# Patient Record
Sex: Male | Born: 1965 | Race: Black or African American | Hispanic: No | Marital: Married | Smoking: Never smoker
Health system: Southern US, Community
[De-identification: ages and names within clinical notes are randomized; demographics above are authoritative.]

## PROBLEM LIST (undated history)

## (undated) DIAGNOSIS — I10 Essential (primary) hypertension: Secondary | ICD-10-CM

## (undated) DIAGNOSIS — I4729 Other ventricular tachycardia: Secondary | ICD-10-CM

## (undated) DIAGNOSIS — E7800 Pure hypercholesterolemia, unspecified: Secondary | ICD-10-CM

## (undated) DIAGNOSIS — I472 Ventricular tachycardia: Secondary | ICD-10-CM

## (undated) DIAGNOSIS — I428 Other cardiomyopathies: Secondary | ICD-10-CM

## (undated) HISTORY — DX: Other cardiomyopathies: I42.8

## (undated) HISTORY — DX: Other ventricular tachycardia: I47.29

## (undated) HISTORY — DX: Ventricular tachycardia: I47.2

---

## 2015-09-06 ENCOUNTER — Ambulatory Visit: Payer: Self-pay

## 2015-11-15 ENCOUNTER — Ambulatory Visit (INDEPENDENT_AMBULATORY_CARE_PROVIDER_SITE_OTHER): Payer: Medicaid Other | Admitting: Family Medicine

## 2015-11-15 VITALS — BP 149/98 | HR 81 | Temp 98.4°F | Ht 68.25 in | Wt 203.4 lb

## 2015-11-15 DIAGNOSIS — E871 Hypo-osmolality and hyponatremia: Secondary | ICD-10-CM | POA: Insufficient documentation

## 2015-11-15 DIAGNOSIS — IMO0001 Reserved for inherently not codable concepts without codable children: Secondary | ICD-10-CM | POA: Insufficient documentation

## 2015-11-15 DIAGNOSIS — Z0289 Encounter for other administrative examinations: Secondary | ICD-10-CM | POA: Insufficient documentation

## 2015-11-15 DIAGNOSIS — M545 Low back pain: Secondary | ICD-10-CM

## 2015-11-15 DIAGNOSIS — R030 Elevated blood-pressure reading, without diagnosis of hypertension: Secondary | ICD-10-CM | POA: Diagnosis not present

## 2015-11-15 DIAGNOSIS — K219 Gastro-esophageal reflux disease without esophagitis: Secondary | ICD-10-CM

## 2015-11-15 DIAGNOSIS — M549 Dorsalgia, unspecified: Secondary | ICD-10-CM | POA: Insufficient documentation

## 2015-11-15 DIAGNOSIS — Z008 Encounter for other general examination: Secondary | ICD-10-CM

## 2015-11-15 DIAGNOSIS — I517 Cardiomegaly: Secondary | ICD-10-CM | POA: Insufficient documentation

## 2015-11-15 LAB — POCT H PYLORI SCREEN: H Pylori Screen, POC: POSITIVE

## 2015-11-15 MED ORDER — ACETAMINOPHEN 500 MG PO TABS: 500.0000 mg | ORAL_TABLET | Freq: Four times a day (QID) | ORAL | Status: DC | PRN

## 2015-11-15 MED ORDER — OMEPRAZOLE 40 MG PO CPDR: 40.0000 mg | DELAYED_RELEASE_CAPSULE | Freq: Every day | ORAL | Status: DC

## 2015-11-15 MED ORDER — OMEPRAZOLE 40 MG PO CPDR
40.0000 mg | DELAYED_RELEASE_CAPSULE | Freq: Every day | ORAL | Status: DC
Start: 2015-11-15 — End: 2017-03-31

## 2015-11-15 NOTE — Patient Instructions (Signed)
It was good to see you today!  Take the purple pill once a day to help with your stomach.  Take the Tylenol for back pain.  Come back in 4 - 6 weeks to see me.
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
## 2015-11-15 NOTE — Assessment & Plan Note (Signed)
Not present at [facility] Department.   Recheck next visit in 4 - 6 weeks.

## 2015-11-15 NOTE — Assessment & Plan Note (Signed)
Presents with some paperwork from the health dept.  Mentions need for stool studies, cardiac enlargement, aortal dilation, and hyponatremia.  Will check BMET today plus stool studies.  Will discuss Echo at next visit.

## 2015-11-16 LAB — COMPREHENSIVE METABOLIC PANEL
ALK PHOS: 89 U/L (ref 40–115)
ALT: 27 U/L (ref 9–46)
AST: 25 U/L (ref 10–35)
Albumin: 4.2 g/dL (ref 3.6–5.1)
BILIRUBIN TOTAL: 0.5 mg/dL (ref 0.2–1.2)
BUN: 9 mg/dL (ref 7–25)
CO2: 28 mmol/L (ref 20–31)
CREATININE: 0.81 mg/dL (ref 0.70–1.33)
Calcium: 9.3 mg/dL (ref 8.6–10.3)
Chloride: 100 mmol/L (ref 98–110)
GLUCOSE: 109 mg/dL — AB (ref 65–99)
Potassium: 4.1 mmol/L (ref 3.5–5.3)
SODIUM: 137 mmol/L (ref 135–146)
Total Protein: 7.4 g/dL (ref 6.1–8.1)

## 2015-11-16 LAB — CBC WITH DIFFERENTIAL/PLATELET
BASOS ABS: 0 10*3/uL (ref 0.0–0.1)
BASOS PCT: 0 % (ref 0–1)
EOS PCT: 7 % — AB (ref 0–5)
Eosinophils Absolute: 0.4 10*3/uL (ref 0.0–0.7)
HEMATOCRIT: 42.6 % (ref 39.0–52.0)
HEMOGLOBIN: 14.4 g/dL (ref 13.0–17.0)
LYMPHS PCT: 52 % — AB (ref 12–46)
Lymphs Abs: 3.3 10*3/uL (ref 0.7–4.0)
MCH: 27.8 pg (ref 26.0–34.0)
MCHC: 33.8 g/dL (ref 30.0–36.0)
MCV: 82.2 fL (ref 78.0–100.0)
MPV: 9.7 fL (ref 8.6–12.4)
Monocytes Absolute: 0.8 10*3/uL (ref 0.1–1.0)
Monocytes Relative: 12 % (ref 3–12)
NEUTROS ABS: 1.8 10*3/uL (ref 1.7–7.7)
Neutrophils Relative %: 29 % — ABNORMAL LOW (ref 43–77)
Platelets: 235 10*3/uL (ref 150–400)
RBC: 5.18 MIL/uL (ref 4.22–5.81)
RDW: 14.3 % (ref 11.5–15.5)
WBC: 6.3 10*3/uL (ref 4.0–10.5)

## 2015-11-16 LAB — LIPID PANEL
Cholesterol: 225 mg/dL — ABNORMAL HIGH (ref 125–200)
HDL: 28 mg/dL — ABNORMAL LOW (ref >=40)
LDL CALC: 140 mg/dL — AB (ref <130)
Total CHOL/HDL Ratio: 8 Ratio — ABNORMAL HIGH (ref <=5.0)
Triglycerides: 284 mg/dL — ABNORMAL HIGH (ref <150)
VLDL: 57 mg/dL — ABNORMAL HIGH (ref <30)

## 2015-11-16 LAB — TSH: TSH: 1.988 u[IU]/mL (ref 0.350–4.500)

## 2015-11-20 ENCOUNTER — Encounter: Payer: Self-pay | Admitting: Family Medicine

## 2015-11-20 LAB — STOOL CULTURE

## 2015-12-15 ENCOUNTER — Encounter: Payer: Self-pay | Admitting: Family Medicine

## 2015-12-15 ENCOUNTER — Ambulatory Visit (INDEPENDENT_AMBULATORY_CARE_PROVIDER_SITE_OTHER): Payer: Medicaid Other | Admitting: Family Medicine

## 2015-12-15 VITALS — BP 148/87 | HR 83 | Temp 98.1°F | Ht 68.25 in | Wt 197.2 lb

## 2015-12-15 DIAGNOSIS — R1013 Epigastric pain: Secondary | ICD-10-CM | POA: Diagnosis not present

## 2015-12-15 DIAGNOSIS — H1013 Acute atopic conjunctivitis, bilateral: Secondary | ICD-10-CM | POA: Diagnosis not present

## 2015-12-15 DIAGNOSIS — E871 Hypo-osmolality and hyponatremia: Secondary | ICD-10-CM

## 2015-12-15 DIAGNOSIS — M545 Low back pain: Secondary | ICD-10-CM | POA: Diagnosis not present

## 2015-12-15 LAB — POCT UA - MICROSCOPIC ONLY

## 2015-12-15 LAB — POCT URINALYSIS DIPSTICK
Bilirubin, UA: NEGATIVE
GLUCOSE UA: NEGATIVE
Ketones, UA: NEGATIVE
LEUKOCYTES UA: NEGATIVE
NITRITE UA: NEGATIVE
PROTEIN UA: NEGATIVE
UROBILINOGEN UA: 1
pH, UA: 6.5

## 2015-12-15 MED ORDER — OLOPATADINE HCL 0.2 % OP SOLN
OPHTHALMIC | Status: DC
Start: 2015-12-15 — End: 2016-08-23

## 2015-12-15 MED ORDER — AMOXICILL-CLARITHRO-LANSOPRAZ PO MISC
Freq: Two times a day (BID) | ORAL | Status: DC
Start: 2015-12-15 — End: 2017-03-31
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
## 2015-12-15 NOTE — Patient Instructions (Signed)
Take the medicine twice daily (it will be 3 pills).  Come back to see me in about 1 month to see how you're doing.    It was good to see you today.

## 2015-12-16 DIAGNOSIS — H1010 Acute atopic conjunctivitis, unspecified eye: Secondary | ICD-10-CM | POA: Insufficient documentation

## 2015-12-16 DIAGNOSIS — R1013 Epigastric pain: Secondary | ICD-10-CM | POA: Insufficient documentation

## 2015-12-16 NOTE — Assessment & Plan Note (Signed)
Improved when checked last visit.

## 2015-12-16 NOTE — Assessment & Plan Note (Addendum)
Positive H pylori screen from last visit.  Treat with triple therapy today.  FU for improvement in 1 month.  Of note, UA was obtained.  Trace RBC, will repeat next visit.

## 2015-12-16 NOTE — Assessment & Plan Note (Signed)
Worsened after arrival in [place]. Treat with [place].

## 2016-08-23 ENCOUNTER — Other Ambulatory Visit: Payer: Self-pay | Admitting: Family Medicine

## 2017-03-31 ENCOUNTER — Ambulatory Visit (INDEPENDENT_AMBULATORY_CARE_PROVIDER_SITE_OTHER): Payer: BLUE CROSS/BLUE SHIELD | Admitting: Family Medicine

## 2017-03-31 DIAGNOSIS — K219 Gastro-esophageal reflux disease without esophagitis: Secondary | ICD-10-CM

## 2017-03-31 MED ORDER — OMEPRAZOLE 40 MG PO CPDR: 40.0000 mg | DELAYED_RELEASE_CAPSULE | Freq: Every day | ORAL | 5 refills | Status: DC

## 2017-03-31 MED ORDER — ACETAMINOPHEN 500 MG PO TABS: 500.0000 mg | ORAL_TABLET | Freq: Four times a day (QID) | ORAL | 11 refills | Status: DC | PRN

## 2017-03-31 MED ORDER — MULTIVITAMIN ADULT PO CHEW: 1.0000 | CHEWABLE_TABLET | Freq: Every day | ORAL | 11 refills | Status: DC

## 2017-03-31 NOTE — Assessment & Plan Note (Signed)
Patient is here complaining of a cough. Signs and symptoms consistent with gastric reflux. Patient has a history of gastric ulcer. - PPI restarted today. - F/u PRN

## 2017-03-31 NOTE — Progress Notes (Signed)
   HPI  CC: COUGH Cough and sputum for about a week. Seems to be worse at work, and at night. But better in at home. Works at [employer].   Has been coughing for ~7 days. Cough is: productive Sputum production: yes, yellow. Medications tried: none Taking blood pressure medications: no  Symptoms Runny nose: no Mucous in back of throat: no Throat burning or reflux: no Wheezing or asthma: no  Fever: no Chest Pain: no Shortness of breath: only when coughing. Leg swelling: no Hemoptysis: no Weight loss: no  ROS see HPI Smoking Status noted  CC, SH/smoking status, and VS noted  Objective: BP (!) 144/90   Pulse 77   Temp 98.5 F (36.9 C) (Oral)   Wt 191 lb (86.6 kg)   BMI 28.83 kg/m  Gen: NAD, alert, cooperative, and pleasant. HEENT: MMM, EOMI, PERRLA, OP minimally erythematous without evidence of exudates, TMs clear bilaterally, no LAD, neck full ROM. CV: RRR, no murmur Resp: CTAB, no wheezes, non-labored Abd: SNTND, BS present, no guarding or organomegaly Ext: No edema, warm Neuro: Alert and oriented, Speech clear, No gross deficits   Assessment and plan:  GERD (gastroesophageal reflux disease) Patient is here complaining of a cough. Signs and symptoms consistent with gastric reflux. Patient has a history of gastric ulcer. - PPI restarted today. - F/u PRN   Meds ordered this encounter  Medications  . acetaminophen (TYLENOL) 500 MG tablet    Sig: Take 1 tablet (500 mg total) by mouth every 6 (six) hours as needed.    Dispense:  30 tablet    Refill:  11  . omeprazole (PRILOSEC) 40 MG capsule    Sig: Take 1 capsule (40 mg total) by mouth daily.    Dispense:  30 capsule    Refill:  5  . Multiple Vitamins-Minerals (MULTIVITAMIN ADULT) CHEW    Sig: Chew 1 tablet by mouth daily.    Dispense:  30 tablet    Refill:  11     Kathee Delton, MD,MS,  PGY3 03/31/2017 6:26 PM

## 2017-03-31 NOTE — Patient Instructions (Signed)
It was a pleasure seeing you today in our clinic. Today we discussed your cough. Here is the treatment plan we have discussed and agreed upon together:   - I believe your cough is likely related to some gastric reflux. Because of this I have prescribed you omeprazole. You have taken this in the past. Take 1 tablet every day for the next 6 months. If your symptoms recur after 6 months come back and see me for a refill.

## 2017-05-28 ENCOUNTER — Encounter: Payer: Self-pay | Admitting: Family Medicine

## 2017-05-28 ENCOUNTER — Ambulatory Visit (INDEPENDENT_AMBULATORY_CARE_PROVIDER_SITE_OTHER): Payer: BLUE CROSS/BLUE SHIELD | Admitting: Family Medicine

## 2017-05-28 VITALS — BP 130/88 | HR 78 | Temp 98.2°F | Ht 68.25 in | Wt 192.0 lb

## 2017-05-28 DIAGNOSIS — R05 Cough: Secondary | ICD-10-CM

## 2017-05-28 DIAGNOSIS — R059 Cough, unspecified: Secondary | ICD-10-CM

## 2017-05-28 MED ORDER — BENZONATATE 100 MG PO CAPS: 200.0000 mg | ORAL_CAPSULE | Freq: Three times a day (TID) | ORAL | 0 refills | Status: DC | PRN

## 2017-05-28 MED ORDER — AZITHROMYCIN 250 MG PO TABS: ORAL_TABLET | ORAL | 0 refills | Status: DC

## 2017-05-28 NOTE — Patient Instructions (Signed)
Start the zpack and tesselon.  Come back if not improving.  Take care,  Dr Jimmey Ralph

## 2017-05-28 NOTE — Progress Notes (Signed)
    Subjective:  Adam Hebert is a 51 y.o. male who presents to the [facility] today with a chief complaint of cough. History is provided by the patient via video Arabic interpretor.   HPI:  Cough Symptoms started about 2 weeks ago. Cough is productive of phlegm. Associated with subjective fever and malaise. He has some shortness of breath and stabbing sensation in his chest when coughing. Has had some sick contacts. Occasional sore throat. No rhinorrhea or chest pain  ROS: Per HPI  PMH: Smoking history reviewed.   Objective:  Physical Exam: BP 130/88   Pulse 78   Temp 98.2 F (36.8 C) (Oral)   Ht 5' 8.25" (1.734 m)   Wt 192 lb (87.1 kg)   SpO2 98%   BMI 28.98 kg/m   Gen: NAD, resting comfortably HEENT: TMs clear. OP clear. No LAD CV: RRR with no murmurs appreciated Pulm: NWOB, Faint bibasilar crackles, otherwise clear.  Skin: warm, dry Neuro: grossly normal, moves all extremities Psych: Normal affect and thought content  Assessment/Plan:  Cough Given two week duration, will treat with z-pack to cover for any atypical pathogens. No fever here, though does have faint crackles on his lung exam. HE does have a history of GERD which could be contributing to chronic cough, however he is currently on omeprazole for this. Will give Rx for tessalon. Work excuse given for the next 3 days. Return precautions reviewed. Follow up as needed.   Katina Degree. Jimmey Ralph, MD [facility] Resident PGY-3 05/28/2017 9:27 AM

## 2017-10-29 NOTE — Progress Notes (Signed)
Subjective:    Patient ID: Adam Hebert, male    DOB: May 28, 1966, 51 y.o.   MRN: [id number]   CC: pain in hand & elevated blood pressure   HPI: Patient accompanied by wife. 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   Pain in hand Patient reports bilateral hand pain which he believes to be 2/2 his new job. Patient began his new job on September 20th, 2018 and since then pain in his hand has progressively gotten worse. Patient is able to grip object and have full ROM but states there is associated pain. Patient states pain is intermittent, more prominent after long use or before bed. Patient states that pain keeps him up at night and he has noticed decreased sleep because of it. Patient states pain radiates to his arms. Patient denies trauma to area, but states that wearing tight gloves at work may cause some hand swelling. Patient states one fingernail was broken at work as well. 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   Patient states that many of his co-workers have gotten notes from their physicians stating they must switch jobs and would like a similar note.   Hypertension: Patient has history of elevated blood pressure readings both in clinic and at home. States that he takes his blood pressure occasionally at [facility] and notices readings with systolics ranging from 295J-884Z and diastolics 66A-63K. Patient has never taken any medications for blood pressure. Patient states that he has a diet of rice, cooked veggies, fish and chicken and has very little salt intake. Patient denies exercise. Patient denies symptoms of SOB, CP, vision changes, LE edema, and only has headaches with associated fever or when over worked. Patient does not smoke or drink alcohol.   Objective:  BP (!) 158/98   Pulse 68   Temp 98 F (36.7 C) (Oral)    Ht '5\' 8"'  (1.727 m)   Wt 185 lb 9.6 oz (84.2 kg)   SpO2 99%   BMI 28.22 kg/m  Vitals and nursing note reviewed  General: well nourished, in no acute distress HEENT: normocephalic, PERRL, no scleral icterus or conjunctival pallor, no nasal discharge, moist mucous membranes,  Cardiac: RRR, clear S1 and S2, no murmurs, rubs, or gallops Respiratory: clear to auscultation bilaterally, no increased work of breathing Abdomen: soft, nontender, nondistended, no masses or organomegaly. Bowel sounds present Extremities: no edema or cyanosis. Warm, well perfused. Normal grip strength (but patient reports pain with grip), normal range of motion of hands. Joint swelling of MCP joints of hands bilaterally.  Skin: warm and dry, no rashes noted Neuro: alert and oriented, no focal deficits  Assessment & Plan:    Bilateral hand pain Likely 2/2 osteoarthritis given that pain is increased with overuse. Possible rheumatoid arthritis given long duration of pain. Possible gout.  -will obtain rheumatoid factor, ESR, and uric acid to rule out RA or gout as causes -will not write note dismissing employee from work at this time as this is his first encounter for pain and no further workup done -follow up if symptoms persist or worsen  -conservative measures at this time, reconsider treatment pending lab results.   Essential hypertension Patient with multiple elevated BP readings in clinic and noted elevated BP readings at home. Patient with no history of prior diagnosis or medications -will begin amlodipine 31m daily, can  titrate up if BP remains elevated  -recheck BP in 1 week with nursing visit -1 month follow up with PCP   Return in about 1 week (around 11/06/2017) for Nursing blood pressure check. And 1 month with PCP for HTN management.   Caroline More, DO, PGY-1

## 2017-10-30 ENCOUNTER — Encounter: Payer: Self-pay | Admitting: Family Medicine

## 2017-10-30 ENCOUNTER — Ambulatory Visit (INDEPENDENT_AMBULATORY_CARE_PROVIDER_SITE_OTHER): Payer: BLUE CROSS/BLUE SHIELD | Admitting: Family Medicine

## 2017-10-30 ENCOUNTER — Other Ambulatory Visit: Payer: Self-pay

## 2017-10-30 VITALS — BP 158/98 | HR 68 | Temp 98.0°F | Ht 68.0 in | Wt 185.6 lb

## 2017-10-30 DIAGNOSIS — M79641 Pain in right hand: Secondary | ICD-10-CM

## 2017-10-30 DIAGNOSIS — I10 Essential (primary) hypertension: Secondary | ICD-10-CM | POA: Diagnosis not present

## 2017-10-30 DIAGNOSIS — M79642 Pain in left hand: Secondary | ICD-10-CM | POA: Diagnosis not present

## 2017-10-30 MED ORDER — AMLODIPINE BESYLATE 5 MG PO TABS: 5.0000 mg | ORAL_TABLET | Freq: Every day | ORAL | 1 refills | Status: DC

## 2017-10-30 NOTE — Patient Instructions (Signed)
Hypertension Hypertension is another name for high blood pressure. High blood pressure forces your heart to work harder to pump blood. This can cause problems over time. There are two numbers in a blood pressure reading. There is a top number (systolic) over a bottom number (diastolic). It is best to have a blood pressure below 120/80. Healthy choices can help lower your blood pressure. You may need medicine to help lower your blood pressure if:  Your blood pressure cannot be lowered with healthy choices.  Your blood pressure is higher than 130/80.  Follow these instructions at home: Eating and drinking  If directed, follow the DASH eating plan. This diet includes: ? Filling half of your plate at each meal with fruits and vegetables. ? Filling one quarter of your plate at each meal with whole grains. Whole grains include whole wheat pasta, brown rice, and whole grain bread. ? Eating or drinking low-fat dairy products, such as skim milk or low-fat yogurt. ? Filling one quarter of your plate at each meal with low-fat (lean) proteins. Low-fat proteins include fish, skinless chicken, eggs, beans, and tofu. ? Avoiding fatty meat, cured and processed meat, or chicken with skin. ? Avoiding premade or processed food.  Eat less than 1,500 mg of salt (sodium) a day.  Limit alcohol use to no more than 1 drink a day for nonpregnant women and 2 drinks a day for men. One drink equals 12 oz of beer, 5 oz of wine, or 1 oz of hard liquor. Lifestyle  Work with your doctor to stay at a healthy weight or to lose weight. Ask your doctor what the best weight is for you.  Get at least 30 minutes of exercise that causes your heart to beat faster (aerobic exercise) most days of the week. This may include walking, swimming, or biking.  Get at least 30 minutes of exercise that strengthens your muscles (resistance exercise) at least 3 days a week. This may include lifting weights or pilates.  Do not use any  products that contain nicotine or tobacco. This includes cigarettes and e-cigarettes. If you need help quitting, ask your doctor.  Check your blood pressure at home as told by your doctor.  Keep all follow-up visits as told by your doctor. This is important. Medicines  Take over-the-counter and prescription medicines only as told by your doctor. Follow directions carefully.  Do not skip doses of blood pressure medicine. The medicine does not work as well if you skip doses. Skipping doses also puts you at risk for problems.  Ask your doctor about side effects or reactions to medicines that you should watch for. Contact a doctor if:  You think you are having a reaction to the medicine you are taking.  You have headaches that keep coming back (recurring).  You feel dizzy.  You have swelling in your ankles.  You have trouble with your vision. Get help right away if:  You get a very bad headache.  You start to feel confused.  You feel weak or numb.  You feel faint.  You get very bad pain in your: ? Chest. ? Belly (abdomen).  You throw up (vomit) more than once.  You have trouble breathing. Summary  Hypertension is another name for high blood pressure.  Making healthy choices can help lower blood pressure. If your blood pressure cannot be controlled with healthy choices, you may need to take medicine. This information is not intended to replace advice given to you by your health care   provider. Make sure you discuss any questions you have with your health care provider. Document Released: 05/06/2008 Document Revised: 10/16/2016 Document Reviewed: 10/16/2016 Elsevier Interactive Patient Education  [employer].    It was a pleasure seeing you today.   Today we discussed your hand pain and high blood pressure.  For hand pain: I believe this likely to be related to arthritis. I have ordered labs to see if you may have rheumatoid arthritis or gout. I cannot write a  note to dismiss you from this job as this is your first presentation of this pain. I will do further workup and after workup can re-consider this.   For your blood pressure: I have started you on amlodipine 5 mg to take daily. Please follow up in 1 week for a nursing visit to check blood pressure and then 1 month with your Primary care doctor for blood pressure management.   Please follow up in 1 week for blood pressure check and 1 month for a doctor's visit for blood pressure or sooner if symptoms persist or worsen. Please call the clinic immediately if you have concerns.   Our clinic's number is [phone]. Please call with questions or concerns.   Thank you,  Oralia Manis, DO

## 2017-10-30 NOTE — Assessment & Plan Note (Signed)
Patient with multiple elevated BP readings in clinic and noted elevated BP readings at home. Patient with no history of prior diagnosis or medications -will begin amlodipine 5mg  daily, can titrate up if BP remains elevated  -recheck BP in 1 week with nursing visit -1 month follow up with PCP

## 2017-10-30 NOTE — Assessment & Plan Note (Signed)
Likely 2/2 osteoarthritis given that pain is increased with overuse. Possible rheumatoid arthritis given long duration of pain. Possible gout.  -will obtain rheumatoid factor, ESR, and uric acid to rule out RA or gout as causes -will not write note dismissing employee from work at this time as this is his first encounter for pain and no further workup done -follow up if symptoms persist or worsen  -conservative measures at this time, reconsider treatment pending lab results.

## 2017-10-31 ENCOUNTER — Encounter: Payer: Self-pay | Admitting: Family Medicine

## 2017-10-31 LAB — RHEUMATOID FACTOR

## 2017-10-31 LAB — SEDIMENTATION RATE: SED RATE: 4 mm/h (ref 0–30)

## 2017-10-31 LAB — URIC ACID: URIC ACID: 5 mg/dL (ref 3.7–8.6)

## 2017-11-06 ENCOUNTER — Ambulatory Visit (INDEPENDENT_AMBULATORY_CARE_PROVIDER_SITE_OTHER): Payer: BLUE CROSS/BLUE SHIELD | Admitting: *Deleted

## 2017-11-06 VITALS — BP 138/90 | HR 70

## 2017-11-06 DIAGNOSIS — I10 Essential (primary) hypertension: Secondary | ICD-10-CM

## 2017-11-06 DIAGNOSIS — Z0130 Encounter for examination of blood pressure without abnormal findings: Secondary | ICD-10-CM

## 2017-11-06 NOTE — Progress Notes (Signed)
   Spoke with patient via [employer] Video Interpreter Najwa ID [id number] Patient presents for BP check after starting amlodipine 5 mg daily. Last dose yesterday at 10:30 AM Patient denies, blurred vision, [facility] Reports 1 mo hx of chest pain starting at mid chest and radiating to back; rates 4/10 at present Also reports frequent left-sided headaches; rates 3/10 at present.  Offered patient same day appt but he has to be at work at 1100. Discussed with Preceptor and ok to give patient appt tomorrow. Scheduled with Dr. Earlene Plater tomorrow at 2:10 PM.   Patient instructed to head directly to ED if develops [facility] or if chest pain worsens. Patient states understanding.  Vitals:   11/06/17 1002  BP: 138/90  Pulse: 70  SpO2: 99%   L. Leward Quan, RN, BSN

## 2017-11-07 ENCOUNTER — Ambulatory Visit: Payer: BLUE CROSS/BLUE SHIELD | Admitting: Internal Medicine
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
## 2017-11-13 ENCOUNTER — Ambulatory Visit (INDEPENDENT_AMBULATORY_CARE_PROVIDER_SITE_OTHER): Payer: BLUE CROSS/BLUE SHIELD | Admitting: Internal Medicine

## 2017-11-13 ENCOUNTER — Other Ambulatory Visit: Payer: Self-pay

## 2017-11-13 ENCOUNTER — Ambulatory Visit (HOSPITAL_COMMUNITY)
Admission: RE | Admit: 2017-11-13 | Discharge: 2017-11-13 | Disposition: A | Discharge status: Home / Self Care | Payer: BLUE CROSS/BLUE SHIELD | Source: Ambulatory Visit | Attending: Family Medicine | Admitting: Family Medicine

## 2017-11-13 VITALS — BP 142/90 | HR 88 | Temp 98.3°F | Ht 68.0 in | Wt 185.0 lb

## 2017-11-13 DIAGNOSIS — I498 Other specified cardiac arrhythmias: Secondary | ICD-10-CM | POA: Diagnosis not present

## 2017-11-13 DIAGNOSIS — R9431 Abnormal electrocardiogram [ECG] [EKG]: Secondary | ICD-10-CM | POA: Insufficient documentation

## 2017-11-13 DIAGNOSIS — E782 Mixed hyperlipidemia: Secondary | ICD-10-CM | POA: Insufficient documentation

## 2017-11-13 DIAGNOSIS — I493 Ventricular premature depolarization: Secondary | ICD-10-CM | POA: Diagnosis not present

## 2017-11-13 DIAGNOSIS — R079 Chest pain, unspecified: Secondary | ICD-10-CM | POA: Diagnosis present

## 2017-11-13 NOTE — Progress Notes (Signed)
Subjective:    Adam Hebert - 51 y.o. male MRN [id number]  Date of birth: 10/29/1966  HPI  Adam Hebert is here for chest pain. Chest pain located on anterior right side of chest as well as in the back and shoulder. Pain started occurring 2 months ago. Patient wonders if this could be related to change in job as he now has to do a lot of heavy lifting for his job and he never experienced any pain like this prior to changing jobs. Pain occurs very intermittently. Lifting is usually what precipitates the pain. Never experiences chest pain with rest. Pain does not occur with walking or physical activity besides lifting. Describes pain as tightness or stabbing. Has never taken nitroglycerin.   Symptoms Nausea/vomiting: no  Shortness of breath: no  Pleuritic pain: no Cough: no Swelling of legs: no Syncope: no Heart burn or food sticking: takes Prisolec for GERD, controls symptoms well  Immobility: no   PMH Denies history of VTE, MI or CVA.  HTN, controlled    -  reports that  has never smoked. he has never used smokeless tobacco. - Review of Systems: Per HPI. - Past Medical History: Patient Active Problem List   Diagnosis Date Noted  . Bilateral hand pain 10/30/2017  . Essential hypertension 10/30/2017  . GERD (gastroesophageal reflux disease) 03/31/2017  . Allergic conjunctivitis 12/16/2015  . Abdominal pain, epigastric 12/16/2015  . Refugee health examination 11/15/2015  . Cardiac enlargement 11/15/2015  . Hyponatremia 11/15/2015  . Elevated blood pressure 11/15/2015  . Back pain 11/15/2015   - Medications: reviewed and updated   Objective:   Physical Exam BP (!) 142/90   Pulse 88   Temp 98.3 F (36.8 C) (Oral)   Ht 5\' 8"  (1.727 m)   Wt 185 lb (83.9 kg)   SpO2 98%   BMI 28.13 kg/m  Gen: NAD, alert, cooperative with exam, well-appearing HEENT: NCAT, PERRL, clear conjunctiva, oropharynx clear, supple neck CV: RRR, good S1/S2, no murmur, no LE  edema, capillary refill brisk, 2+ radial and pedal pulses  Resp: CTABL, no wheezes, non-labored MSK: No TTP over anterior chest wall. TTP over right latissimus dorsi. Full ROM at shoulders bilaterally.  Neuro: Strength 5/5 in upper extremities bilaterally. Normal gait.  Psych: good insight, alert and oriented    Assessment & Plan:   1. Chest pain, unspecified type Suspect chest pain is likely musculoskeletal in nature given that it only occurs with lifting and immediately afterwards and is associated with back pain that seems to be very muscular in nature as well. Pain does not seem anginal in nature as it is not substernal, does not occur with exacerbation, and has no associated symptoms. EKG obtained today and showed no acute ST segment changes. Unable to compare to prior EKG as none available for review in EMR. EKG did show evidence of LVH likely due to long history of HTN. Patient is low risk by HEART score.  Cardiac enlargement is noted on problem list. Patient is unable to provide further information and no prior echo or CXR available for review. Do not feel that patient warrants further cardiac work up at present and that he is stable for outpatient follow up with PCP. Will defer to PCP to decide if referral to cardiologist is warranted. Would recommend BMET and lipid panel for risk stratification as last labs noted to be from 2 years prior. Discussed with patient that I suspect this is msk in nature. Recommended interventions such  as lifting with knees and not back, tylenol/ibuprofen prn, and heating pads/hot baths at the end of the work day. Strict return precautions of substernal or left sided chest pain, chest pain associated with SOB/nausea or vomiting/diaphoresis, or chest pain that radiates to jaw or neck discussed.   Marcy Siren, D.O. 11/13/2017, 9:16 AM PGY-3, [facility]

## 2017-11-13 NOTE — Patient Instructions (Signed)
I do not believe your chest pain and back pain are related to your heart. I suspect it is related to pulled muscles from heavy lifting. Keep your appointment with Dr. Abelardo Diesel on 12/19 to follow up.   If you have chest pain in the center or left side of your chest that is occurs at rest or with walking, you have shortness of breath with your pain, you get nauseous/vomit/get sweaty with pain, or pain radiates to jaw or neck you need to be seen.

## 2017-11-13 NOTE — Progress Notes (Signed)
   Subjective   Patient ID: Adam Hebert    DOB: 22-Mar-1966, 51 y.o. male   MRN: [id number] [employer] Arabic interpreter used: [phone]  CC: "Follow-up test results"  HPI: Adam Hebert is a 51 y.o. male who presents to clinic today for the following:  Hand pain: Patient continues to have hand pain bilaterally affecting PIP joint primarily.  This is been a chronic issue for him.  He attributes this to his work in a [employer] which requires him to use his hands throughout the day.  He has been using ibuprofen 1-2 times per day with significant improvement of his pain.  He is not try Tylenol.  He denies fevers or chills, headache, rash, myalgias.  Hypertension: Patient has been tolerating you prescription for amlodipine.  He does not check his blood pressure at home.  He is a never smoker.  He denies lower extremity swelling since starting the medication.  Patient denies chest pain, shortness of breath.  Colorectal cancer screening: Patient states he is never had a colonoscopy or alternative colorectal cancer screening.  He is a never smoker he denies history of IV drug use.  He denies symptoms of weight loss, nausea or vomiting, abdominal pain, constipation or diarrhea, melena or hematochezia.  ROS: see HPI for pertinent.  PMFSH: HTN, HLD, GERD. Surgical history unremarkable. Family history unremarkable. Smoking status reviewed. Medications reviewed.  Objective   BP 119/80 (BP Location: Left Arm, Patient Position: Sitting, Cuff Size: Normal)   Pulse 69   Temp 98.8 F (37.1 C) (Oral)   Ht 5' 8.25" (1.734 m)   Wt 184 lb 9.6 oz (83.7 kg)   SpO2 99%   BMI 27.86 kg/m  Vitals and nursing note reviewed.  General: well nourished, well developed, NAD with non-toxic appearance HEENT: normocephalic, atraumatic, moist mucous membranes Neck: supple, non-tender without lymphadenopathy Cardiovascular: regular rate and rhythm without murmurs, rubs, or gallops Lungs:  clear to auscultation bilaterally with normal work of breathing Abdomen: soft, non-tender, non-distended, normoactive bowel sounds Skin: warm, dry, no rashes or lesions, cap refill < 2 seconds Extremities: warm and well perfused, normal tone, no edema, significant Heberden nodules on all digits of hands bilaterally with minimal tenderness on palpation without erythema  Assessment & Plan   Osteoarthritis of hands, bilateral Chronic.  Consistent with OA.  Recent labs rule out autoimmune etiology.  Likely due to repetitive use of joints given job.  No signs of systemic involvement.  Seems to improve with NSAIDs. - Initiating meloxicam 7.5 mg daily with instructions to discontinue other NSAIDs, patient can supplement with acetaminophen as needed  Primary hypertension Chronic.  Controlled.  Tolerating amlodipine since last visit. - Continue amlodipine 5 mg daily - RTC 3 months  Colon cancer screening No previous history of screening.  No red flags to suggest need for diagnostic colonoscopy. - Given form with instructions to call to schedule for screening colonoscopy  No orders of the defined types were placed in this encounter.  Meds ordered this encounter  Medications  . meloxicam (MOBIC) 7.5 MG tablet    Sig: Take 1 tablet (7.5 mg total) by mouth daily.    Dispense:  90 tablet    Refill:  0    Durward Parcel, DO [facility], PGY-2 11/19/2017, 11:23 AM

## 2017-11-19 ENCOUNTER — Other Ambulatory Visit: Payer: Self-pay

## 2017-11-19 ENCOUNTER — Ambulatory Visit (INDEPENDENT_AMBULATORY_CARE_PROVIDER_SITE_OTHER): Payer: BLUE CROSS/BLUE SHIELD | Admitting: Family Medicine

## 2017-11-19 ENCOUNTER — Encounter: Payer: Self-pay | Admitting: Family Medicine

## 2017-11-19 VITALS — BP 119/80 | HR 69 | Temp 98.8°F | Ht 68.25 in | Wt 184.6 lb

## 2017-11-19 DIAGNOSIS — M19041 Primary osteoarthritis, right hand: Secondary | ICD-10-CM | POA: Insufficient documentation

## 2017-11-19 DIAGNOSIS — M19042 Primary osteoarthritis, left hand: Secondary | ICD-10-CM

## 2017-11-19 DIAGNOSIS — Z1211 Encounter for screening for malignant neoplasm of colon: Secondary | ICD-10-CM | POA: Diagnosis not present

## 2017-11-19 DIAGNOSIS — I10 Essential (primary) hypertension: Secondary | ICD-10-CM

## 2017-11-19 MED ORDER — MELOXICAM 7.5 MG PO TABS: 7.5000 mg | ORAL_TABLET | Freq: Every day | ORAL | 0 refills | Status: DC

## 2017-11-19 NOTE — Assessment & Plan Note (Addendum)
No previous history of screening.  No red flags to suggest need for diagnostic colonoscopy. - Given form with instructions to call to schedule for screening colonoscopy

## 2017-11-19 NOTE — Assessment & Plan Note (Addendum)
Chronic.  Consistent with OA.  Recent labs rule out autoimmune etiology.  Likely due to repetitive use of joints given job.  No signs of systemic involvement.  Seems to improve with NSAIDs. - Initiating meloxicam 7.5 mg daily with instructions to discontinue other NSAIDs, patient can supplement with acetaminophen as needed

## 2017-11-19 NOTE — Assessment & Plan Note (Addendum)
Chronic.  Controlled.  Tolerating amlodipine since last visit. - Continue amlodipine 5 mg daily - RTC 3 months

## 2017-11-19 NOTE — Patient Instructions (Signed)
    .    1.       Mobic          .    Tylenol   .             .           . 2.  .       . 3.     1  3       .      [phone]         .     Marland Kitchen          PGY-2  shukraan liziaratikum liruyatina alyawma. yrja alaitlae 'adnah limurajaeat khatatina alkhasat biziarat alyawm. 1. laqad 'arsalat fi diwa' jadid yusamaa Mobic aldhy satakhudhuh maratan wahidatan ywmyaan , qabl saeatan min aleaml. yumkinuk takmilatan mae Tylenol 'iidha lizam al'amr. la takhudh aybwbrwfyn 'aw mdadat alailtihab ghyr alsstirwyiydiat albadilat 'athna' tanawul hdha aldawa'. wahadha yashmal alyf , mutrin , 'adfil , mashuq qabl almilad. 2. mutabaeat 'amludibayna. ytmu altahakum fi daght damaha bishakl jayidin. 3. laqad 'aetaytuk namudhajaan lilaitisal 1 min 3 'arqam almuqadamat watalab 'iijra' fahs alqawlawn bialminzaar. yrja alaitisal bialeiadat ealaa [phone] 'iidha kanat al'aerad tazdad sw'ana 'aw ladayk 'ayi makhawif. kan min dawaei sarurina likhidmatik. difid makmulin , du  sihat [employer] , PGY-2

## 2017-11-28 ENCOUNTER — Other Ambulatory Visit: Payer: Self-pay | Admitting: Family Medicine

## 2017-11-28 DIAGNOSIS — I10 Essential (primary) hypertension: Secondary | ICD-10-CM

## 2017-11-28 MED ORDER — AMLODIPINE BESYLATE 5 MG PO TABS: 5.0000 mg | ORAL_TABLET | Freq: Every day | ORAL | 3 refills | Status: DC

## 2018-03-26 NOTE — Progress Notes (Deleted)
   Subjective   Patient ID: Adam Hebert    DOB: 1966/09/11, 52 y.o. male   MRN: [id number]  CC: "***"  HPI: Adam Hebert is a 52 y.o. male who presents to clinic today for the following:  ***: ***  ROS: see HPI for pertinent.  PMFSH: HTN, HLD, GERD. Surgical history unremarkable. Family history unremarkable. Smoking status reviewed. Medications reviewed.  Objective   There were no vitals taken for this visit. Vitals and nursing note reviewed.  General: well nourished, well developed, NAD with non-toxic appearance HEENT: normocephalic, atraumatic, moist mucous membranes Neck: supple, non-tender without lymphadenopathy Cardiovascular: regular rate and rhythm without murmurs, rubs, or gallops Lungs: clear to auscultation bilaterally with normal work of breathing Abdomen: soft, non-tender, non-distended, normoactive bowel sounds Skin: warm, dry, no rashes or lesions, cap refill < 2 seconds Extremities: warm and well perfused, normal tone, no edema  Assessment & Plan   No problem-specific Assessment & Plan notes found for this encounter.  No orders of the defined types were placed in this encounter.  No orders of the defined types were placed in this encounter.   Durward Parcel, DO [facility], PGY-2 03/26/2018, 11:00 AM

## 2018-03-27 ENCOUNTER — Ambulatory Visit: Payer: BLUE CROSS/BLUE SHIELD | Admitting: Family Medicine

## 2018-06-08 ENCOUNTER — Ambulatory Visit (INDEPENDENT_AMBULATORY_CARE_PROVIDER_SITE_OTHER): Payer: BLUE CROSS/BLUE SHIELD | Admitting: Family Medicine

## 2018-06-08 ENCOUNTER — Encounter: Payer: Self-pay | Admitting: Family Medicine

## 2018-06-08 VITALS — BP 125/80 | HR 65 | Temp 98.5°F | Wt 188.4 lb

## 2018-06-08 DIAGNOSIS — K219 Gastro-esophageal reflux disease without esophagitis: Secondary | ICD-10-CM | POA: Diagnosis not present

## 2018-06-08 DIAGNOSIS — G44229 Chronic tension-type headache, not intractable: Secondary | ICD-10-CM

## 2018-06-08 DIAGNOSIS — I10 Essential (primary) hypertension: Secondary | ICD-10-CM

## 2018-06-08 MED ORDER — ACETAMINOPHEN 500 MG PO TABS: 500.0000 mg | ORAL_TABLET | Freq: Four times a day (QID) | ORAL | 3 refills | Status: DC | PRN

## 2018-06-08 MED ORDER — MULTIVITAMIN ADULT PO CHEW: 1.0000 | CHEWABLE_TABLET | Freq: Every day | ORAL | 3 refills | Status: DC

## 2018-06-08 MED ORDER — OMEPRAZOLE 40 MG PO CPDR: 40.0000 mg | DELAYED_RELEASE_CAPSULE | Freq: Every day | ORAL | 3 refills | Status: DC

## 2018-06-08 NOTE — Assessment & Plan Note (Signed)
Chronic.  Likely multifactorial with intermittent use of amlodipine, poor sleeping pattern including working night shift, poor hydration.  No red flags. - Given prescription for Tylenol 650 mg every 6 hours as needed - Emphasized importance of regular sleep with goal of 7 hours daily and adequate hydration and while at work - Reviewed return precautions

## 2018-06-08 NOTE — Patient Instructions (Signed)
Thank you for coming in to see [place] today. Please see below to review our plan for today's visit.  1.  Take Tylenol, 1 tablet every 6 hours as needed for headache.  Drink plenty of water and get at least 7 hours of sleep daily. 2.  Continue taking the omeprazole for your reflux.  Follow-up with the stomach doctor to arrange for the colonoscopy. 3.  Return to the clinic in 1 month.  I will call you if your labs are abnormal.  Please call the clinic at [phone] if your symptoms worsen or you have any concerns. It was our pleasure to serve you.  Durward Parcel, DO [facility], PGY-2

## 2018-06-08 NOTE — Assessment & Plan Note (Signed)
Chronic.  Controlled with PPI.  No red flags. - Given refill for omeprazole 40 mg daily - Emphasized importance of colorectal cancer screening, patient to contact to schedule screening colonoscopy

## 2018-06-08 NOTE — Progress Notes (Signed)
Subjective   Patient ID: Adam Hebert    DOB: October 16, 1966, 52 y.o. male   MRN: [id number]  CC: "Headaches"  HPI: Adam Hebert is a 52 y.o. male who presents to clinic today for the following:  Headaches: Patient here today to follow-up for intermittent occipital headache.  Onset approximately 1 month ago with gradual progression.  Patient has been taking ibuprofen 1-2 tablets every other day for chronic and arthritis without improvement of headache.  Patient takes amlodipine as an as needed medication when he has headache symptoms.  He endorses worsening symptoms later in the day and at night.  He currently works at a factory on the night shift regularly and endorses poor sleep and hydration.  Patient states symptoms are worse in the heat and improve with hydration and food.  Denies any illicit drug use or alcohol use.  He is a never smoker.  Patient denies he was or chills, nausea or vomiting, weakness, change in vision, neck pain or stiffness.  GERD: Patient tolerating long-term use of omeprazole daily.  Patient uses medication 1 hour after meals.  Patient has been contacted by GI regarding colonoscopy but has to arrange for appointment based on work schedule.  He denies unexplained weight loss, melena or hematochezia, diarrhea or constipation.  Hypertension: Never smoker.  On amlodipine but taking on an as-needed basis.  ROS: see HPI for pertinent.  [place]: HTN, HLD, GERD. Surgical history unremarkable. Family history unremarkable. Smoking status reviewed. Medications reviewed.  Objective   BP 125/80 (BP Location: Right Arm, Patient Position: Sitting, Cuff Size: Normal)   Pulse 65   Temp 98.5 F (36.9 C) (Oral)   Wt 188 lb 6.4 oz (85.5 kg)   SpO2 100%   BMI 28.44 kg/m  Vitals and nursing note reviewed.  General: well nourished, well developed, NAD with non-toxic appearance HEENT: normocephalic, atraumatic, moist mucous membranes, PERRLA, EOMI, TM grey and  clearly visible bilaterally Neck: supple, non-tender without lymphadenopathy Cardiovascular: regular rate and rhythm without murmurs, rubs, or gallops Lungs: clear to auscultation bilaterally with normal work of breathing Abdomen: soft, non-tender, non-distended, normoactive bowel sounds Skin: warm, dry, no rashes or lesions, cap refill < 2 seconds Extremities: warm and well perfused, normal tone, no edema Neuro: CNII-XII intact, no dysarthria, no facial droop, fine motor intact  Assessment & Plan   Primary hypertension Chronic.  Controlled though patient is not aware of proper use of amlodipine.  Never smoker. - Discussed importance of using amlodipine daily - Checking CMET, CBC, lipid panel - RTC 1 month  GERD (gastroesophageal reflux disease) Chronic.  Controlled with PPI.  No red flags. - Given refill for omeprazole 40 mg daily - Emphasized importance of colorectal cancer screening, patient to contact to schedule screening colonoscopy  Chronic tension-type headache, not intractable Chronic.  Likely multifactorial with intermittent use of amlodipine, poor sleeping pattern including working night shift, poor hydration.  No red flags. - Given prescription for Tylenol 650 mg every 6 hours as needed - Emphasized importance of regular sleep with goal of 7 hours daily and adequate hydration and while at work - Reviewed return precautions  Orders Placed This Encounter  Procedures  . CMP14+EGFR  . CBC  . Lipid panel   Meds ordered this encounter  Medications  . acetaminophen (TYLENOL) 500 MG tablet    Sig: Take 1 tablet (500 mg total) by mouth every 6 (six) hours as needed.    Dispense:  90 tablet    Refill:  3  .  omeprazole (PRILOSEC) 40 MG capsule    Sig: Take 1 capsule (40 mg total) by mouth daily.    Dispense:  90 capsule    Refill:  3  . DISCONTD: Multiple Vitamins-Minerals (MULTIVITAMIN ADULT) CHEW    Sig: Chew 1 tablet by mouth daily.    Dispense:  90 tablet     Refill:  3  . Multiple Vitamins-Minerals (MULTIVITAMIN ADULT) CHEW    Sig: Chew 1 tablet by mouth daily.    Dispense:  90 tablet    Refill:  [place], [place], PGY-2 06/08/2018, 12:29 PM

## 2018-06-08 NOTE — Assessment & Plan Note (Addendum)
Chronic.  Controlled though patient is not aware of proper use of amlodipine.  Never smoker. - Discussed importance of using amlodipine daily - Checking CMET, CBC, lipid panel - RTC 1 month

## 2018-06-09 ENCOUNTER — Encounter: Payer: Self-pay | Admitting: Family Medicine

## 2018-06-09 LAB — CBC
HEMATOCRIT: 44.6 % (ref 37.5–51.0)
HEMOGLOBIN: 14.8 g/dL (ref 13.0–17.7)
MCH: 28.7 pg (ref 26.6–33.0)
MCHC: 33.2 g/dL (ref 31.5–35.7)
MCV: 86 fL (ref 79–97)
Platelets: 201 10*3/uL (ref 150–450)
RBC: 5.16 x10E6/uL (ref 4.14–5.80)
RDW: 13.8 % (ref 12.3–15.4)
WBC: 7 10*3/uL (ref 3.4–10.8)

## 2018-06-09 LAB — CMP14+EGFR
ALT: 23 IU/L (ref 0–44)
AST: 23 IU/L (ref 0–40)
Albumin/Globulin Ratio: 1.4 (ref 1.2–2.2)
Albumin: 4.4 g/dL (ref 3.5–5.5)
Alkaline Phosphatase: 103 IU/L (ref 39–117)
BUN/Creatinine Ratio: 14 (ref 9–20)
BUN: 13 mg/dL (ref 6–24)
Bilirubin Total: 0.4 mg/dL (ref 0.0–1.2)
CO2: 23 mmol/L (ref 20–29)
Calcium: 9.1 mg/dL (ref 8.7–10.2)
Chloride: 101 mmol/L (ref 96–106)
Creatinine, Ser: 0.95 mg/dL (ref 0.76–1.27)
GFR calc Af Amer: 105 mL/min/1.73
GFR calc non Af Amer: 91 mL/min/1.73
Globulin, Total: 3.2 g/dL (ref 1.5–4.5)
Glucose: 100 mg/dL — ABNORMAL HIGH (ref 65–99)
Potassium: 4.2 mmol/L (ref 3.5–5.2)
Sodium: 139 mmol/L (ref 134–144)
Total Protein: 7.6 g/dL (ref 6.0–8.5)

## 2018-06-09 LAB — LIPID PANEL
Chol/HDL Ratio: 6.2 ratio — ABNORMAL HIGH (ref 0.0–5.0)
Cholesterol, Total: 230 mg/dL — ABNORMAL HIGH (ref 100–199)
HDL: 37 mg/dL — ABNORMAL LOW
LDL Calculated: 154 mg/dL — ABNORMAL HIGH (ref 0–99)
Triglycerides: 195 mg/dL — ABNORMAL HIGH (ref 0–149)
VLDL Cholesterol Cal: 39 mg/dL (ref 5–40)

## 2018-11-30 ENCOUNTER — Ambulatory Visit (INDEPENDENT_AMBULATORY_CARE_PROVIDER_SITE_OTHER): Payer: BLUE CROSS/BLUE SHIELD | Admitting: Family Medicine

## 2018-11-30 ENCOUNTER — Inpatient Hospital Stay (HOSPITAL_COMMUNITY)
Admission: EM | Admit: 2018-11-30 | Discharge: 2018-12-05 | DRG: 286 | Disposition: A | Discharge status: Home / Self Care | Payer: BLUE CROSS/BLUE SHIELD | Source: Ambulatory Visit | Attending: Family Medicine | Admitting: Family Medicine

## 2018-11-30 ENCOUNTER — Encounter (HOSPITAL_COMMUNITY): Payer: Self-pay | Admitting: Emergency Medicine

## 2018-11-30 ENCOUNTER — Ambulatory Visit (HOSPITAL_COMMUNITY)
Admission: RE | Admit: 2018-11-30 | Discharge: 2018-11-30 | Disposition: A | Discharge status: Home / Self Care | Payer: BLUE CROSS/BLUE SHIELD | Source: Ambulatory Visit | Attending: Family Medicine | Admitting: Family Medicine

## 2018-11-30 ENCOUNTER — Encounter: Payer: Self-pay | Admitting: Family Medicine

## 2018-11-30 ENCOUNTER — Emergency Department (HOSPITAL_COMMUNITY): Payer: BLUE CROSS/BLUE SHIELD

## 2018-11-30 ENCOUNTER — Other Ambulatory Visit: Payer: Self-pay

## 2018-11-30 ENCOUNTER — Other Ambulatory Visit (HOSPITAL_COMMUNITY): Payer: Self-pay

## 2018-11-30 VITALS — BP 125/75 | HR 72 | Temp 98.2°F | Wt 189.2 lb

## 2018-11-30 DIAGNOSIS — M25511 Pain in right shoulder: Secondary | ICD-10-CM | POA: Diagnosis present

## 2018-11-30 DIAGNOSIS — K219 Gastro-esophageal reflux disease without esophagitis: Secondary | ICD-10-CM

## 2018-11-30 DIAGNOSIS — I514 Myocarditis, unspecified: Secondary | ICD-10-CM | POA: Diagnosis present

## 2018-11-30 DIAGNOSIS — E782 Mixed hyperlipidemia: Secondary | ICD-10-CM | POA: Diagnosis present

## 2018-11-30 DIAGNOSIS — I959 Hypotension, unspecified: Secondary | ICD-10-CM | POA: Diagnosis present

## 2018-11-30 DIAGNOSIS — I428 Other cardiomyopathies: Secondary | ICD-10-CM | POA: Diagnosis present

## 2018-11-30 DIAGNOSIS — R001 Bradycardia, unspecified: Secondary | ICD-10-CM

## 2018-11-30 DIAGNOSIS — R079 Chest pain, unspecified: Secondary | ICD-10-CM | POA: Insufficient documentation

## 2018-11-30 DIAGNOSIS — I5021 Acute systolic (congestive) heart failure: Secondary | ICD-10-CM | POA: Diagnosis present

## 2018-11-30 DIAGNOSIS — I472 Ventricular tachycardia: Secondary | ICD-10-CM | POA: Diagnosis present

## 2018-11-30 DIAGNOSIS — I110 Hypertensive heart disease with heart failure: Principal | ICD-10-CM | POA: Diagnosis present

## 2018-11-30 DIAGNOSIS — R55 Syncope and collapse: Secondary | ICD-10-CM | POA: Diagnosis present

## 2018-11-30 DIAGNOSIS — I493 Ventricular premature depolarization: Secondary | ICD-10-CM | POA: Diagnosis present

## 2018-11-30 DIAGNOSIS — E860 Dehydration: Secondary | ICD-10-CM | POA: Diagnosis present

## 2018-11-30 DIAGNOSIS — R0789 Other chest pain: Secondary | ICD-10-CM

## 2018-11-30 DIAGNOSIS — I5020 Unspecified systolic (congestive) heart failure: Secondary | ICD-10-CM

## 2018-11-30 DIAGNOSIS — R42 Dizziness and giddiness: Secondary | ICD-10-CM | POA: Diagnosis not present

## 2018-11-30 DIAGNOSIS — K5900 Constipation, unspecified: Secondary | ICD-10-CM | POA: Diagnosis present

## 2018-11-30 DIAGNOSIS — Z23 Encounter for immunization: Secondary | ICD-10-CM

## 2018-11-30 DIAGNOSIS — Z79899 Other long term (current) drug therapy: Secondary | ICD-10-CM

## 2018-11-30 DIAGNOSIS — I4729 Other ventricular tachycardia: Secondary | ICD-10-CM

## 2018-11-30 DIAGNOSIS — I10 Essential (primary) hypertension: Secondary | ICD-10-CM | POA: Diagnosis not present

## 2018-11-30 DIAGNOSIS — R9431 Abnormal electrocardiogram [ECG] [EKG]: Secondary | ICD-10-CM | POA: Diagnosis not present

## 2018-11-30 DIAGNOSIS — R931 Abnormal findings on diagnostic imaging of heart and coronary circulation: Secondary | ICD-10-CM | POA: Diagnosis not present

## 2018-11-30 HISTORY — DX: Essential (primary) hypertension: I10

## 2018-11-30 HISTORY — DX: Pure hypercholesterolemia, unspecified: E78.00

## 2018-11-30 LAB — PHOSPHORUS: Phosphorus: 4.2 mg/dL (ref 2.5–4.6)

## 2018-11-30 LAB — URINALYSIS, ROUTINE W REFLEX MICROSCOPIC
Bilirubin Urine: NEGATIVE
Glucose, UA: NEGATIVE mg/dL
HGB URINE DIPSTICK: NEGATIVE
Ketones, ur: NEGATIVE mg/dL
Leukocytes, UA: NEGATIVE
Nitrite: NEGATIVE
Protein, ur: NEGATIVE mg/dL
Specific Gravity, Urine: 1.014 (ref 1.005–1.030)
pH: 7 (ref 5.0–8.0)

## 2018-11-30 LAB — TROPONIN I: Troponin I: <0.03 ng/mL (ref <0.03)

## 2018-11-30 LAB — CBC
HCT: 41.5 % (ref 39.0–52.0)
Hemoglobin: 14.3 g/dL (ref 13.0–17.0)
MCH: 28.5 pg (ref 26.0–34.0)
MCHC: 34.5 g/dL (ref 30.0–36.0)
MCV: 82.8 fL (ref 80.0–100.0)
PLATELETS: 220 10*3/uL (ref 150–400)
RBC: 5.01 MIL/uL (ref 4.22–5.81)
RDW: 12.6 % (ref 11.5–15.5)
WBC: 7.7 10*3/uL (ref 4.0–10.5)
nRBC: 0 % (ref 0.0–0.2)

## 2018-11-30 LAB — LIPASE, BLOOD: Lipase: 16 U/L (ref 11–51)

## 2018-11-30 LAB — I-STAT CG4 LACTIC ACID, ED
Lactic Acid, Venous: 1.93 mmol/L — ABNORMAL HIGH (ref 0.5–1.9)
Lactic Acid, Venous: 1.42 mmol/L (ref 0.5–1.9)

## 2018-11-30 LAB — COMPREHENSIVE METABOLIC PANEL
ALT: 26 U/L (ref 0–44)
AST: 27 U/L (ref 15–41)
Albumin: 4.1 g/dL (ref 3.5–5.0)
Alkaline Phosphatase: 78 U/L (ref 38–126)
Anion gap: 12 (ref 5–15)
BUN: 13 mg/dL (ref 6–20)
CO2: 23 mmol/L (ref 22–32)
Calcium: 9.2 mg/dL (ref 8.9–10.3)
Chloride: 101 mmol/L (ref 98–111)
Creatinine, Ser: 0.91 mg/dL (ref 0.61–1.24)
Glucose, Bld: 107 mg/dL — ABNORMAL HIGH (ref 70–99)
Potassium: 3.9 mmol/L (ref 3.5–5.1)
Sodium: 136 mmol/L (ref 135–145)
Total Bilirubin: 0.9 mg/dL (ref 0.3–1.2)
Total Protein: 7.8 g/dL (ref 6.5–8.1)

## 2018-11-30 LAB — HEMOGLOBIN A1C
Hgb A1c MFr Bld: 5.4 % (ref 4.8–5.6)
Mean Plasma Glucose: 108.28 mg/dL

## 2018-11-30 LAB — TSH: TSH: 2.111 u[IU]/mL (ref 0.350–4.500)

## 2018-11-30 LAB — MAGNESIUM: MAGNESIUM: 2 mg/dL (ref 1.7–2.4)

## 2018-11-30 MED ORDER — ENOXAPARIN SODIUM 40 MG/0.4ML ~~LOC~~ SOLN
40.0000 mg | SUBCUTANEOUS | Status: DC
  Administered 2018-11-30 – 2018-12-01 (×2): 40 mg via SUBCUTANEOUS
  Filled 2018-11-30 (×2): qty 0.4

## 2018-11-30 MED ORDER — SODIUM CHLORIDE 0.9% FLUSH
3.0000 mL | Freq: Two times a day (BID) | INTRAVENOUS | Status: DC
  Administered 2018-11-30 – 2018-12-05 (×7): 3 mL via INTRAVENOUS

## 2018-11-30 MED ORDER — SENNA 8.6 MG PO TABS
1.0000 | ORAL_TABLET | Freq: Two times a day (BID) | ORAL | Status: DC
  Administered 2018-11-30 – 2018-12-05 (×10): 8.6 mg via ORAL
  Filled 2018-11-30 (×10): qty 1

## 2018-11-30 MED ORDER — INFLUENZA VAC SPLIT QUAD 0.5 ML IM SUSY
0.5000 mL | PREFILLED_SYRINGE | INTRAMUSCULAR | Status: AC
  Administered 2018-12-01: 0.5 mL via INTRAMUSCULAR
  Filled 2018-11-30: qty 0.5

## 2018-11-30 MED ORDER — SODIUM CHLORIDE 0.9 % IV SOLN: 250.0000 mL | INTRAVENOUS | Status: DC | PRN

## 2018-11-30 MED ORDER — ACETAMINOPHEN 500 MG PO TABS: 500.0000 mg | ORAL_TABLET | Freq: Four times a day (QID) | ORAL | Status: DC | PRN

## 2018-11-30 MED ORDER — SODIUM CHLORIDE 0.9 % IV BOLUS
1000.0000 mL | Freq: Once | INTRAVENOUS | Status: AC
  Administered 2018-11-30: 1000 mL via INTRAVENOUS

## 2018-11-30 MED ORDER — SODIUM CHLORIDE 0.9% FLUSH: 3.0000 mL | INTRAVENOUS | Status: DC | PRN

## 2018-11-30 MED ORDER — PANTOPRAZOLE SODIUM 40 MG PO TBEC
80.0000 mg | DELAYED_RELEASE_TABLET | Freq: Every day | ORAL | Status: DC
  Administered 2018-12-01 – 2018-12-05 (×5): 80 mg via ORAL
  Filled 2018-11-30 (×5): qty 2

## 2018-11-30 MED ORDER — POLYETHYLENE GLYCOL 3350 17 G PO PACK
17.0000 g | PACK | Freq: Every day | ORAL | Status: DC
  Administered 2018-12-01 – 2018-12-05 (×5): 17 g via ORAL
  Filled 2018-11-30 (×5): qty 1

## 2018-11-30 MED ORDER — OLOPATADINE HCL 0.1 % OP SOLN
1.0000 [drp] | Freq: Two times a day (BID) | OPHTHALMIC | Status: DC
  Administered 2018-11-30 – 2018-12-05 (×10): 1 [drp] via OPHTHALMIC
  Filled 2018-11-30: qty 5

## 2018-11-30 MED ORDER — OMEPRAZOLE 40 MG PO CPDR: 40.0000 mg | DELAYED_RELEASE_CAPSULE | Freq: Every day | ORAL | 3 refills | Status: DC

## 2018-11-30 NOTE — Progress Notes (Signed)
Subjective   Patient ID: Adam Hebert    DOB: 1966/10/21, 52 y.o. male   MRN: [id number]  CC: "Medication refill and cough"  HPI: Adam Hebert is a 52 y.o. male who presents to clinic today for the following:  Chest pain: Patient reports 3 weeks of nonproductive cough.  He does have known GERD which he feels is improved with his Prilosec.  Patient also reporting right-sided chest pain which is worse with his cough and seems to be positional and aggravated with work particularly with moving his right shoulder.  He has no prior history of chest pain he denies shortness of breath or diaphoresis associated with the pain.  He is a non-smoker and denies family history of cardiac disease.  He describes pain as aching and resolves when he is not coughing or moving his right upper extremity.    GERD: Patient with longstanding reflux well controlled with omeprazole.  He does report occasional nausea at times without vomiting.  Patient would like a refill of his PPI today.    Constipation: Patient reports sensation of constipation.  He also has a history of blood in his stool.  Patient has no prior history of colon cancer screening.  He is interested in a colonoscopy today.  He denies change in weight, abdominal pain.  Presyncope: Patient reports feeling lightheaded while lying on the exam table prior to receiving EKG.  Episode lasted a couple of minutes and completely resolved.  Patient experienced second episode while standing up and getting his shirt back on after the EKG.  He has no prior history of presyncopal effect.  ROS: see HPI for pertinent.  PMFSH: HTN, HLD, GERD.Surgical history unremarkable. Family history unremarkable. Smoking status reviewed. Medications reviewed.  Objective   BP 125/75 (BP Location: Right Arm, Patient Position: Sitting, Cuff Size: Large)   Pulse 72   Temp 98.2 F (36.8 C) (Oral)   Wt 189 lb 3.2 oz (85.8 kg)   SpO2 100%   BMI 28.56 kg/m    Vitals and nursing note reviewed.  General: well nourished, well developed, NAD with non-toxic appearance HEENT: normocephalic, atraumatic, moist mucous membranes, PERRLA, EOMI Neck: supple, non-tender without lymphadenopathy Cardiovascular: regular rate and rhythm without murmurs, rubs, or gallops, right anterior chest wall tender with gentle palpation and movement of right arm Lungs: clear to auscultation bilaterally with normal work of breathing Abdomen: soft, non-tender, non-distended, normoactive bowel sounds Skin: warm, dry, no rashes or lesions, cap refill < 2 seconds Extremities: warm and well perfused, normal tone, no edema  Assessment & Plan   Episodic lightheadedness Acute.  Seems to be orthostatic in nature.  Patient inappropriately bradycardic at 48 during evaluation.  Heart rate positive with orthostatics.  This may be the source of his symptoms.  Uncertain if this is related to his heart, however EKG without blocks or ST changes or T wave abnormalities. - Given symptomatic bradycardia, patient in agreement to have further evaluation at ED  Atypical chest pain Acute.  Right-sided chest pain following 3 weeks of cough.  Seems to be positional.  EKG reassuring without ST changes or T wave abnormalities. - See plan for episodic lightheadedness  GERD (gastroesophageal reflux disease) Chronic.  Controlled. - Ambulatory referral to GI for diagnostic colonoscopy and consideration for EGD - Given refill for omeprazole 40 mg daily  Orders Placed This Encounter  Procedures  . Ambulatory referral to Gastroenterology    Referral Priority:   Routine    Referral Type:  Consultation    Referral Reason:   Specialty Services Required    Number of Visits Requested:   1  . EKG 12-Lead  . EKG 12-Lead    Ordered by an unspecified provider    Meds ordered this encounter  Medications  . omeprazole (PRILOSEC) 40 MG capsule    Sig: Take 1 capsule (40 mg total) by mouth daily.     Dispense:  90 capsule    Refill:  3    Durward Parcelavid McMullen, DO [facility], PGY-3 11/30/2018, 11:07 AM

## 2018-11-30 NOTE — ED Notes (Signed)
Had an episode of hypotension and bradycardia after IV start. Denies pain at present.

## 2018-11-30 NOTE — ED Provider Notes (Signed)
[facility] EMERGENCY DEPARTMENT Provider Note   CSN: [id number] Arrival date & time: 11/30/18  1127     History   Chief Complaint Chief Complaint  Patient presents with  . Near Syncope    HPI Adam Hebert is a 52 y.o. male.  The history is provided by the patient and medical records. The history is limited by a language barrier. A language interpreter was used.  Chest Pain   This is a new problem. The current episode started 1 to 2 hours ago. The problem occurs rarely. The problem has not changed since onset.The pain is associated with rest. The pain is present in the substernal region. The pain is moderate. The quality of the pain is described as heavy and dull. The pain does not radiate. Duration of episode(s) is 10 minutes. Associated symptoms include cough, malaise/fatigue, nausea, near-syncope and shortness of breath. Pertinent negatives include no abdominal pain, no back pain, no diaphoresis, no dizziness, no fever, no headaches, no irregular heartbeat, no leg pain, no lower extremity edema, no numbness, no palpitations, no sputum production, no syncope, no vomiting and no weakness. He has tried rest for the symptoms. The treatment provided no relief.    No past medical history on file.  Patient Active Problem List   Diagnosis Date Noted  . Atypical chest pain 11/30/2018  . Episodic lightheadedness 11/30/2018  . Chronic tension-type headache, not intractable 06/08/2018  . Osteoarthritis of hands, bilateral 11/19/2017  . Mixed hyperlipidemia 11/13/2017  . Primary hypertension 10/30/2017  . GERD (gastroesophageal reflux disease) 03/31/2017  . Cardiac enlargement 11/15/2015  . Back pain 11/15/2015    History reviewed. No pertinent surgical history.      Home Medications    Prior to Admission medications   Medication Sig Start Date End Date Taking? Authorizing Provider  acetaminophen (TYLENOL) 500 MG tablet Take 1 tablet (500 mg total)  by mouth every 6 (six) hours as needed. 06/08/18   Wendee Beavers, DO  amLODipine (NORVASC) 5 MG tablet Take 1 tablet (5 mg total) by mouth daily. 11/28/17   Wendee Beavers, DO  ibuprofen (ADVIL,MOTRIN) 800 MG tablet Take 800 mg by mouth every 8 (eight) hours as needed.    [provider]  Multiple Vitamins-Minerals (MULTIVITAMIN ADULT) CHEW Chew 1 tablet by mouth daily. 06/08/18   Wendee Beavers, DO  omeprazole (PRILOSEC) 40 MG capsule Take 1 capsule (40 mg total) by mouth daily. 11/30/18   Wendee Beavers, DO  PATADAY 0.2 % SOLN INSTILL 2 DROPS INTO [facility] EYE DAILY Patient not taking: Reported on 06/08/2018 08/23/16   McKeag, Janine Ores, MD    Family History No family history on file.  Social History Social History   Tobacco Use  . Smoking status: Never Smoker  . Smokeless tobacco: Never Used  Substance Use Topics  . Alcohol use: Not on file  . Drug use: Not on file     Allergies   Patient has no known allergies.   Review of Systems Review of Systems  Constitutional: Positive for chills, fatigue and malaise/fatigue. Negative for diaphoresis and fever.  HENT: Positive for congestion.   Eyes: Negative for visual disturbance.  Respiratory: Positive for cough, chest tightness and shortness of breath. Negative for sputum production, wheezing and stridor.   Cardiovascular: Positive for chest pain and near-syncope. Negative for palpitations, leg swelling and syncope.  Gastrointestinal: Positive for constipation and nausea. Negative for abdominal pain, diarrhea and vomiting.  Genitourinary: Negative for flank pain.  Musculoskeletal: Negative for back pain, neck pain and neck stiffness.  Skin: Negative for rash and wound.  Neurological: Positive for light-headedness. Negative for dizziness, syncope, speech difficulty, weakness, numbness and headaches.  Psychiatric/Behavioral: Negative for agitation.     Physical Exam Updated Vital Signs BP (!) 89/65   Pulse (!) 54    Temp 98.4 F (36.9 C) (Oral)   Resp (!) 29   Wt 85 kg   SpO2 100%   BMI 28.28 kg/m   Physical Exam Vitals signs and nursing note reviewed.  Constitutional:      General: He is not in acute distress.    Appearance: He is well-developed. He is not ill-appearing, toxic-appearing or diaphoretic.  HENT:     Head: Normocephalic and atraumatic.     Nose: No congestion or rhinorrhea.     Mouth/Throat:     Pharynx: No oropharyngeal exudate or posterior oropharyngeal erythema.  Eyes:     Extraocular Movements: Extraocular movements intact.     Conjunctiva/sclera: Conjunctivae normal.     Pupils: Pupils are equal, round, and reactive to light.  Neck:     Musculoskeletal: Neck supple. No neck rigidity or muscular tenderness.  Cardiovascular:     Rate and Rhythm: Regular rhythm. Bradycardia present.     Pulses: Normal pulses.     Heart sounds: No murmur.  Pulmonary:     Effort: Pulmonary effort is normal. No respiratory distress.     Breath sounds: Normal breath sounds. No wheezing, rhonchi or rales.  Chest:     Chest wall: No tenderness.  Abdominal:     General: There is no distension.     Palpations: Abdomen is soft.     Tenderness: There is no abdominal tenderness.  Musculoskeletal:     Right lower leg: No edema.     Left lower leg: No edema.  Skin:    General: Skin is warm and dry.     Capillary Refill: Capillary refill takes less than 2 seconds.     Findings: No erythema or rash.  Neurological:     General: No focal deficit present.     Mental Status: He is alert.      ED Treatments / Results  Labs (all labs ordered are listed, but only abnormal results are displayed) Labs Reviewed  COMPREHENSIVE METABOLIC PANEL - Abnormal; Notable for the following components:      Result Value   Glucose, Bld 107 (*)    All other components within normal limits  I-STAT CG4 LACTIC ACID, ED - Abnormal; Notable for the following components:   Lactic Acid, Venous 1.93 (*)    All  other components within normal limits  URINE CULTURE  CBC  MAGNESIUM  URINALYSIS, ROUTINE W REFLEX MICROSCOPIC  TSH  LIPASE, BLOOD  HIV ANTIBODY (ROUTINE TESTING W REFLEX)  BASIC METABOLIC PANEL  CBC  HEMOGLOBIN A1C  PHOSPHORUS  LIPID PANEL  TROPONIN I  TROPONIN I  TROPONIN I  I-STAT TROPONIN, ED  I-STAT CG4 LACTIC ACID, ED    EKG EKG Interpretation  Date/Time:  Monday November 30 2018 12:31:43 EST Ventricular Rate:  53 PR Interval:    QRS Duration: 109 QT Interval:  439 QTC Calculation: 413 R Axis:   -25 Text Interpretation:  Sinus rhythm Abnormal R-wave progression, early transition LVH with secondary repolarization abnormality When compared to prior, pt now has t wave inversionsi in leads 2, 3, AVF, V5-V6.  no STEMI Confirmed by Theda Belfast ([id number]) on 11/30/2018 1:29:31 PM   Radiology  Dg Chest 2 View  Result Date: 11/30/2018 CLINICAL DATA:  Chest pain EXAM: CHEST - 2 VIEW COMPARISON:  None. FINDINGS: Mild cardiomegaly. No confluent airspace opacities, effusions or edema. No acute bony abnormality. IMPRESSION: Mild cardiomegaly.  No active disease. Electronically Signed   By: Charlett Nose M.D.   On: 11/30/2018 13:18    Procedures Procedures (including critical care time)  Medications Ordered in ED Medications  sodium chloride 0.9 % bolus 1,000 mL (has no administration in time range)     Initial Impression / Assessment and Plan / ED Course  I have reviewed the triage vital signs and the nursing notes.  Pertinent labs & imaging results that were available during my care of the patient were reviewed by me and considered in my medical decision making (see chart for details).     Adam Hebert is a 52 y.o. male who speaks Arabic with a past medical history significant for GERD, hypertension, hyperlipidemia, history of cardiac enlargement, who presents from PCP office for further evaluation of near syncopal episode with associated bradycardia and  hypotension.  Patient says that for the last 2 weeks he has had some subjective fevers and chills as well as congestion and cough.  He has had cold symptoms.  He said he is had some associated constipation but no diarrhea.  He is had some urinary frequency.  He has had nausea but no vomiting.  He is accompanied by an interpreter who reports that patient was his PCP today and while he was there, had an episode of near syncope and very lightheaded.  When patient was worked up with a monitor he was found to have heart rate in the low 40s and blood pressure in the 80s.  Patient had several episodes of this at the PCP office and he was sent to the emergency department for evaluation.  Patient reports he also had chest pain during these episodes that he described as an aching and pressure across his chest.  He says he is not having chest pain right now but still feels lightheaded.  He denies other complaints on arrival.  On exam, patient's heart rate is in the 60s.  Nursing saw drop into the 40s and he again was hypotensive with a blood pressure of 89 systolic.  Patient was tachypneic during this episode.  Patient did not syncopized.  She does not feel it was a bradycardic episode.  Patient lungs were clear and chest was nontender.  Back and abdomen were nontender.  No focal neurologic deficits on initial exam and patient had no leg tenderness or leg swelling.  Patient given fluids and will have work-up to look for etiology of his lightheadedness in the setting of bradycardia and hypotension.  X-ray will look for pneumonia given the recent cough and congestion.  Urinalysis will look for UTI given the frequency.  Anticipate reassessment and patient may require admission given the vital sign abnormalities and symptoms.  4:06 PM Patient continues to have intermittent episodes of bradycardia into the 40s.  EKG shows T wave inversions compared to prior.  Laboratory testing was overall reassuring.  Lactic acid was  slightly elevated and he was given some fluids.  Lipase not elevated.  Magnesium normal.  Metabolic panel urinalysis and CBC reassuring.  Unclear etiology of the patient's bradycardia, hypotension, T wave inversions, and near syncopal episodes.  However I am concerned about sending him home as these episodes are continuing.  Family medicine will be called for admission for telemetry monitoring  and rehydration.   Final Clinical Impressions(s) / ED Diagnoses   Final diagnoses:  Near syncope  Bradycardia  Hypotension, unspecified hypotension type    ED Discharge Orders    None     Clinical Impression: 1. Near syncope   2. Bradycardia   3. Hypotension, unspecified hypotension type     Disposition: Admit  This note was prepared with assistance of Dragon voice recognition software. Occasional wrong-word or sound-a-like substitutions may have occurred due to the inherent limitations of voice recognition software.     , Canary Brimhristopher J, MD 11/30/18 [phone]

## 2018-11-30 NOTE — ED Triage Notes (Signed)
To ED via [facility] from doctor's office with episode of hypotension/bradycardia - subjectively per office staff. Pt was there for regular visit and when staff came back into room- noticed pt was less responsive-- no documented rhythm strip for ems--  For EMS - pt was alert/oriented x 4, denies any pain.

## 2018-11-30 NOTE — Assessment & Plan Note (Signed)
Acute.  Right-sided chest pain following 3 weeks of cough.  Seems to be positional.  EKG reassuring without ST changes or T wave abnormalities. - See plan for episodic lightheadedness

## 2018-11-30 NOTE — Assessment & Plan Note (Addendum)
Chronic.  Controlled. - Ambulatory referral to GI for diagnostic colonoscopy and consideration for EGD - Given refill for omeprazole 40 mg daily

## 2018-11-30 NOTE — Progress Notes (Signed)
Received call from MD re: troponin supposed to be drawn around 17:37,Call made to lab [facility],spoke with Holly,states that lab was just drawn. Adam Hebert, Drinda Butts, Charity fundraiser

## 2018-11-30 NOTE — H&P (Addendum)
Family Medicine Teaching [facility] Admission History and Physical Service Pager: [phone]  Patient name: Adam Hebert Medical record number: [id number] Date of birth: 1966/04/10 Age: 52 y.o. Gender: male  Primary Care Provider: Wendee Beavers, DO Consultants: None Code Status: Full  Chief Complaint: dizziness  Assessment and Plan: Adam Hebert is a 52 y.o. male presenting with near syncope . PMH is significant for HTN, HLD.   Near-syncope: in the setting of bradycardia to high 40s. Associated with episodic hypotension to 89/65, which resolved on its own, now 140s systolic.  His other complaints consist of muscular shoulder pain, mild nausea that improves with eating and subjective fevers in the past week. His physical exam was remarkable for reproducible chest/shoulder pain on the right side, and mild crackles in his lower fields bilaterally.  Vitals on admission were remarkable for bradycardia into the 50s, hypotension of 89/65.  Initial labs remarkable for lactate of 1.9 which down trended to 1.4 with fluids, seems to be cardiac in nature.  Chest x-ray was remarkable for mildly enlarged cardiac silhouette and EKG showed T wave inversion in inferior and lateral leads.  The differential at this time includes cardiac etiology (arrhythmia such as paroxysmal afib or aflutter, heart block), infectious etiology, vasovagal response, dehydration, malnourishment.  Infectious cause seems unlikely due to no obvious source of infection on chest x-ray, no elevated WBC, a history without strong support for infection.  Vasovagal response seems unlikely due to lack of triggers during his presyncopal episode earlier today.  Dehydration/malnourishment (patient did not eat today) may be contributing.  Cardiac etiology may be contributing as evidenced by the T wave inversions on EKG.  Troponins pending and chest pain noted by patient does not seem to be from a cardiac cause.  No cardiac  history other than HTN and HLD. No tobacco use.  No previous echo. TSH WNL at 2.11.  - Admit to FPTS, attending Dr. Jennette Kettle - monitor on telemetry - check echo - AM EKG - trend troponin - check orthostatics - Follow-up A1c - Follow-up lipid panel - vitals per unit routine - if he has another acute episode, would check the telemetry strip and order EKG, recheck hypotension and monitor for resolution - if we are unable to catch an episode on telemetry, may need an event monitor as an outpatient - PT/OT  Productive cough 3 weeks duration. No leukocytosis.  Subjective fevers at night noted by patient.no fevers noted in the hospital so far.  Chest x-ray showed no acute pulmonary findings.  Vitals stable though episodes of bradycardia + hypotension as noted above.  Lactic acid is already trended to within normal limits after liter normal saline bolus in the ED.  Low suspicion for pneumonia at this time. - check flu - continue prilosec - monitor for fevers  Right shoulder pain - worse over the past 3 weeks. 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  He has noted multiple coworkers with a similar shoulder pain.  Physical exam appears consistent with musculoskeletal shoulder pain.  Considered aortic dissection, though no widened mediastinum and patient is non-toxic appearing on admission. -Tylenol PRN for pain -Continue to monitor - can generate a letter recommending two weeks of rest from work that requires that repetitive motion  Nausea/Constipation - Nausea is worse before he eats, when he is hungry. Improves with eating. No episodes of emesis. Last BM was 2 weeks ago and there was  a small amount of streaking blood on the stool.  Hemoglobin is 14.3. - miralax 17g QD, titerate for 1-2 soft stools per day  HLD - Last lipid panel from 06/08/2018 showed cholesterol 230, HDL 37, LDL 154, Triglycerides 195. Current 10 year risk: 3.8%. -Follow-up  lipid panel   FEN/GI: heart healthy diet Prophylaxis: lovenox  Disposition: Admit to FPTS, telemetry  History of Present Illness:  Adam Hebert is a 52 y.o. male presenting with presyncope.  History obtained with help of an in person Arabic interpreter.  Mr. Alvira presented to his PCP today to be evaluated for cough, GERD, constipation.  During his doctor's visit felt significantly lightheaded while lying on the exam table.  The dizzy spell lasted several minutes and then resolved.  Serial vital measurements taken in clinic showed consistent bradycardia and low blood pressures.  At no point did he lose consciousness.  Due to his near syncope, he was encouraged to be evaluated in the ED.  He noted that he has had one previous episode of lightheadedness/dizziness for which she had to take a break from work and drink water.  This previous episode was about 6 months ago.  During his dizzy spell today, he denied chest pain, palpitations, vision changes. The dizziness seemed to be associated with position changes, in particular when he goes from laying to standing.  He has had a productive, nonbloody cough for the past 3 weeks but he does not think that his episode of lightheadedness was related to his coughing.  He noted that he has not had anything to eat today.  He also noted some right upper chest pain and right shoulder pain.  He has been experiencing this pain for about the past day since he worked a long shift at [employer] using his shoulder for repetitive motion at work.  He reports that many people at work have similar shoulder pain from repetitive motion.  He reports that his pain is 10/4 muscular feeling pain in his right upper chest and right shoulder.  Seems exacerbated by the use of his shoulder.  He does not notice this upper chest pain during walking/exertion.  He has not taken any medication for this chest/shoulder pain.  On review of systems, he noted subjective fevers at  night in the past week, occasional headaches that improved with medication and constipation with a streak of blood on his last bowel movement (about 2 weeks ago), and mild nausea that improves with eating.  In the ED, chest x-ray showed mild cardiomegaly but no active disease, EKG noted LVH with T wave inversion in inferior and lateral leads, his lactic acid was found to be 1.9 which decreased to 1.4 after fluid bolus.  Review Of Systems: Per HPI with the following additions:   Review of Systems  Constitutional: Positive for fever (subjective fevers at night). Negative for chills.  HENT: Negative for congestion, hearing loss and sore throat.   Respiratory: Positive for cough and sputum production. Negative for hemoptysis, shortness of breath and wheezing.   Cardiovascular: Negative for chest pain and palpitations.  Gastrointestinal: Positive for blood in stool, constipation and nausea. Negative for abdominal pain, diarrhea, melena and vomiting.  Genitourinary: Negative for dysuria, frequency and urgency.  Musculoskeletal: Positive for back pain.  Neurological: Positive for dizziness and headaches.    Patient Active Problem List   Diagnosis Date Noted  . Atypical chest pain 11/30/2018  . Episodic lightheadedness 11/30/2018  . Pre-syncope 11/30/2018  . Chronic tension-type headache, not  intractable 06/08/2018  . Osteoarthritis of hands, bilateral 11/19/2017  . Mixed hyperlipidemia 11/13/2017  . Primary hypertension 10/30/2017  . GERD (gastroesophageal reflux disease) 03/31/2017  . Cardiac enlargement 11/15/2015  . Back pain 11/15/2015    Past Medical History: Past Medical History:  Diagnosis Date  . Hypertension     Past Surgical History: History reviewed. No pertinent surgical history.  Social History: Social History   Tobacco Use  . Smoking status: Never Smoker  . Smokeless tobacco: Never Used  Substance Use Topics  . Alcohol use: Never    Alcohol/week: 0.0 standard  drinks    Frequency: Never  . Drug use: Never   Additional social history: Lives at home with wife and two daughters Please also refer to relevant sections of EMR.  Family History: No family history on file.  Allergies and Medications: No Known Allergies No current facility-administered medications on file prior to encounter.    Current Outpatient Medications on File Prior to Encounter  Medication Sig Dispense Refill  . PATADAY 0.2 % SOLN INSTILL 2 DROPS INTO EACH EYE DAILY (Patient taking differently: Place 2 drops into both eyes daily. ) 2.5 mL 2  . acetaminophen (TYLENOL) 500 MG tablet Take 1 tablet (500 mg total) by mouth every 6 (six) hours as needed. 90 tablet 3  . amLODipine (NORVASC) 5 MG tablet Take 1 tablet (5 mg total) by mouth daily. 90 tablet 3  . ibuprofen (ADVIL,MOTRIN) 800 MG tablet Take 800 mg by mouth every 8 (eight) hours as needed.    . Multiple Vitamins-Minerals (MULTIVITAMIN ADULT) CHEW Chew 1 tablet by mouth daily. 90 tablet 3  . omeprazole (PRILOSEC) 40 MG capsule Take 1 capsule (40 mg total) by mouth daily. 90 capsule 3    Objective: BP (!) 139/98 (BP Location: Right Arm)   Pulse 79   Temp 98.3 F (36.8 C) (Oral)   Resp 15   Wt 85 kg   SpO2 100%   BMI 28.28 kg/m  Exam: Physical Exam Constitutional:      General: He is not in acute distress.    Appearance: Normal appearance. He is normal weight. He is not ill-appearing.  HENT:     Nose: No congestion.     Mouth/Throat:     Mouth: Mucous membranes are dry.  Eyes:     Conjunctiva/sclera: Conjunctivae normal.     Pupils: Pupils are equal, round, and reactive to light.  Neck:     Musculoskeletal: Normal range of motion and neck supple. No neck rigidity or muscular tenderness.  Cardiovascular:     Rate and Rhythm: Normal rate.     Pulses: Normal pulses.     Heart sounds: Murmur (1/6 systolic murmur at apex) present. No friction rub. No gallop.   Pulmonary:     Effort: Pulmonary effort is normal.      Breath sounds: Rales (in lower fields bilaterally) present.  Abdominal:     General: Abdomen is flat. Bowel sounds are normal.     Palpations: Abdomen is soft.     Tenderness: There is no abdominal tenderness.  Musculoskeletal:        General: No swelling or tenderness.     Comments: Right upper chest and posterior deltoid tender to palpation.  Mild pain with exterior rotation of the the shoulder.  No significant pain with internal rotation.  Pain with abduction of the right shoulder though full range of motion is possible.  Skin:    General: Skin is warm.  Neurological:  General: No focal deficit present.     Mental Status: He is alert.     Cranial Nerves: No cranial nerve deficit.  Psychiatric:        Mood and Affect: Mood normal.    Labs and Imaging:  EKG- new ST inversions  CBC BMET  Recent Labs  Lab 11/30/18 1230  WBC 7.7  HGB 14.3  HCT 41.5  PLT 220   Recent Labs  Lab 11/30/18 1230  NA 136  K 3.9  CL 101  CO2 23  BUN 13  CREATININE 0.91  GLUCOSE 107*  CALCIUM 9.2     Dg Chest 2 View  Result Date: 11/30/2018 CLINICAL DATA:  Chest pain EXAM: CHEST - 2 VIEW COMPARISON:  None. FINDINGS: Mild cardiomegaly. No confluent airspace opacities, effusions or edema. No acute bony abnormality. IMPRESSION: Mild cardiomegaly.  No active disease. Electronically Signed   By: Charlett Nose M.D.   On: 11/30/2018 13:18   Mirian Mo, MD 11/30/2018, 6:11 PM PGY-1, [facility] Family Medicine FPTS Intern pager: [phone], text pages welcome  I have separately seen and examined the patient. I have discussed the findings and exam with Dr. Homero Fellers and agree with the above note.  My changes/additions are outlined in BLUE.   Howard Pouch, MD PGY-3 [facility]

## 2018-11-30 NOTE — Assessment & Plan Note (Signed)
Acute.  Seems to be orthostatic in nature.  Patient inappropriately bradycardic at 48 during evaluation.  Heart rate positive with orthostatics.  This may be the source of his symptoms.  Uncertain if this is related to his heart, however EKG without blocks or ST changes or T wave abnormalities. - Given symptomatic bradycardia, patient in agreement to have further evaluation at ED

## 2018-11-30 NOTE — ED Notes (Signed)
Had another episode of bradycardia while labs drawn from IV- dr tegeler notified -at bedside

## 2018-12-01 ENCOUNTER — Encounter (HOSPITAL_COMMUNITY): Payer: Self-pay | Admitting: Cardiology

## 2018-12-01 ENCOUNTER — Inpatient Hospital Stay (HOSPITAL_COMMUNITY): Payer: BLUE CROSS/BLUE SHIELD

## 2018-12-01 DIAGNOSIS — R55 Syncope and collapse: Secondary | ICD-10-CM

## 2018-12-01 DIAGNOSIS — R9431 Abnormal electrocardiogram [ECG] [EKG]: Secondary | ICD-10-CM

## 2018-12-01 DIAGNOSIS — R931 Abnormal findings on diagnostic imaging of heart and coronary circulation: Secondary | ICD-10-CM

## 2018-12-01 DIAGNOSIS — I959 Hypotension, unspecified: Secondary | ICD-10-CM

## 2018-12-01 DIAGNOSIS — R001 Bradycardia, unspecified: Secondary | ICD-10-CM

## 2018-12-01 LAB — BASIC METABOLIC PANEL
Anion gap: 10 (ref 5–15)
BUN: 13 mg/dL (ref 6–20)
CO2: 22 mmol/L (ref 22–32)
Calcium: 9.1 mg/dL (ref 8.9–10.3)
Chloride: 104 mmol/L (ref 98–111)
Creatinine, Ser: 0.91 mg/dL (ref 0.61–1.24)
GFR calc Af Amer: >60 mL/min (ref >60)
GFR calc non Af Amer: >60 mL/min (ref >60)
Glucose, Bld: 140 mg/dL — ABNORMAL HIGH (ref 70–99)
Potassium: 3.7 mmol/L (ref 3.5–5.1)
SODIUM: 136 mmol/L (ref 135–145)

## 2018-12-01 LAB — CBC
HCT: 40.6 % (ref 39.0–52.0)
Hemoglobin: 13.6 g/dL (ref 13.0–17.0)
MCH: 27.6 pg (ref 26.0–34.0)
MCHC: 33.5 g/dL (ref 30.0–36.0)
MCV: 82.4 fL (ref 80.0–100.0)
Platelets: 215 10*3/uL (ref 150–400)
RBC: 4.93 MIL/uL (ref 4.22–5.81)
RDW: 12.8 % (ref 11.5–15.5)
WBC: 7.6 10*3/uL (ref 4.0–10.5)
nRBC: 0 % (ref 0.0–0.2)

## 2018-12-01 LAB — HIV ANTIBODY (ROUTINE TESTING W REFLEX): HIV Screen 4th Generation wRfx: NONREACTIVE

## 2018-12-01 LAB — ECHOCARDIOGRAM COMPLETE
Height: 72 in
Weight: 2998.26 oz

## 2018-12-01 LAB — LIPID PANEL
CHOL/HDL RATIO: 6.5 ratio
Cholesterol: 222 mg/dL — ABNORMAL HIGH (ref 0–200)
HDL: 34 mg/dL — ABNORMAL LOW (ref >40)
LDL Cholesterol: 138 mg/dL — ABNORMAL HIGH (ref 0–99)
Triglycerides: 251 mg/dL — ABNORMAL HIGH (ref <150)
VLDL: 50 mg/dL — ABNORMAL HIGH (ref 0–40)

## 2018-12-01 LAB — URINE CULTURE: Culture: NO GROWTH

## 2018-12-01 LAB — INFLUENZA PANEL BY PCR (TYPE A & B)
INFLBPCR: NEGATIVE
Influenza A By PCR: NEGATIVE

## 2018-12-01 LAB — TROPONIN I
Troponin I: <0.03 ng/mL (ref <0.03)
Troponin I: <0.03 ng/mL (ref <0.03)

## 2018-12-01 MED ORDER — METOPROLOL TARTRATE 12.5 MG HALF TABLET
12.5000 mg | ORAL_TABLET | Freq: Two times a day (BID) | ORAL | Status: DC
  Administered 2018-12-01 – 2018-12-03 (×5): 12.5 mg via ORAL
  Filled 2018-12-01 (×5): qty 1

## 2018-12-01 MED ORDER — ATORVASTATIN CALCIUM 40 MG PO TABS
40.0000 mg | ORAL_TABLET | Freq: Every day | ORAL | Status: DC
  Administered 2018-12-02 – 2018-12-04 (×3): 40 mg via ORAL
  Filled 2018-12-01 (×3): qty 1

## 2018-12-01 MED ORDER — POTASSIUM CHLORIDE CRYS ER 20 MEQ PO TBCR
20.0000 meq | EXTENDED_RELEASE_TABLET | Freq: Once | ORAL | Status: AC
  Administered 2018-12-01: 20 meq via ORAL
  Filled 2018-12-01: qty 1

## 2018-12-01 NOTE — Evaluation (Signed)
Physical Therapy Evaluation-1x Patient Details Name: Adam Hebert MRN: [id number] DOB: 02/23/1966 Today's Date: 12/01/2018   History of Present Illness  52 yo male admitted with pre-syncope, bradycardia, episodic hypotension.   Clinical Impression  On eval, pt was Ind with mobility. He denied any dizziness. See orthostatics in vitals signs section. No PT needs. 1x eval. Will sign off.     Follow Up Recommendations No PT follow up    Equipment Recommendations  None recommended by PT    Recommendations for Other Services       Precautions / Restrictions Precautions Precautions: None Restrictions Weight Bearing Restrictions: No      Mobility  Bed Mobility Overal bed mobility: Independent                Transfers Overall transfer level: Independent                  Ambulation/Gait Ambulation/Gait assistance: Independent Gait Distance (Feet): 250 Feet Assistive device: None Gait Pattern/deviations: WFL(Within Functional Limits)        Stairs            Wheelchair Mobility    Modified Rankin (Stroke Patients Only)       Balance Overall balance assessment: No apparent balance deficits (not formally assessed)                                           Pertinent Vitals/Pain Pain Assessment: No/denies pain    Home Living Family/patient expects to be discharged to:: Private residence Living Arrangements: Spouse/significant other;Children Available Help at Discharge: Family Type of Home: House         Home Equipment: None      Prior Function Level of Independence: Independent               Hand Dominance        Extremity/Trunk Assessment   Upper Extremity Assessment Upper Extremity Assessment: Defer to OT evaluation    Lower Extremity Assessment Lower Extremity Assessment: Overall WFL for tasks assessed    Cervical / Trunk Assessment Cervical / Trunk Assessment: Normal  Communication   Communication: No difficulties;Prefers language other than English  Cognition Arousal/Alertness: Awake/alert Behavior During Therapy: WFL for tasks assessed/performed Overall Cognitive Status: Within Functional Limits for tasks assessed                                        General Comments      Exercises     Assessment/Plan    PT Assessment Patent does not need any further PT services  PT Problem List         PT Treatment Interventions      PT Goals (Current goals can be found in the Care Plan section)  Acute Rehab PT Goals Patient Stated Goal: home PT Goal Formulation: All assessment and education complete, DC therapy    Frequency     Barriers to discharge        Co-evaluation               AM-PAC PT "6 Clicks" Mobility  Outcome Measure Help needed turning from your back to your side while in a flat bed without using bedrails?: None Help needed moving from lying on your back to sitting on the side of a  flat bed without using bedrails?: None Help needed moving to and from a bed to a chair (including a wheelchair)?: None Help needed standing up from a chair using your arms (e.g., wheelchair or bedside chair)?: None Help needed to walk in hospital room?: None Help needed climbing 3-5 steps with a railing? : None 6 Click Score: 24    End of Session   Activity Tolerance: Patient tolerated treatment well Patient left: in bed;with call bell/phone within reach;with family/visitor present        Time: 7711-6579 PT Time Calculation (min) (ACUTE ONLY): 10 min   Charges:   PT Evaluation $PT Eval Low Complexity: 1 Low            Rebeca Alert, PT Acute Rehabilitation Services Pager: [phone] Office: [phone]

## 2018-12-01 NOTE — Progress Notes (Signed)
2D Echocardiogram has been performed.  Adam Hebert 12/01/2018, 2:46 PM

## 2018-12-01 NOTE — Consult Note (Addendum)
Cardiology Consultation:   Patient ID: Adam Hebert MRN: [id number]; DOB: [birth year]  Admit date: 11/30/2018 Date of Consult: 12/01/2018  Primary Care Provider: Wendee Beavers, DO Primary Cardiologist: No primary care provider on file. NEW  Primary Electrophysiologist:  None   Patient Profile:   Adam Hebert is a 52 y.o. male from [place] and speaks Arabic with a hx of HTN, HLD who is being seen today for the evaluation of near syncope and hypotension with admit for hypotension and bradycardia in the 40s at the request of Dr. Jennette Kettle.  History of Present Illness:   Adam Hebert with hx HTN, reflux and HLD, was at his PCP for cough and complained of feeling lightheaded on Exam table.  Lasted a couple of min and resolved.  Then had second episode while standing up.  Initial pulse in office 72 with BP 125/72  And initial EKG with SR at 65, then EKG at 1231 with SB at 53 LVH and T wave inversion in inf. Lat leads.   Today's EKGs with SR and LVH and upright T waves.  I personally reviewed all EKGs.    BP with HR in 50s at 89/65 then BP back up 139/98.  Telemetry:  Telemetry was personally reviewed and demonstrates:  SR with sinus arrhythmia and PVCs and one episode of 7 beats of NSVT.   Troponin neg <0.03 X 3 Na 136, K+ 3.9, BUN 13, Cr 0.91 LFTs normal  Hgb 14.3, Hct 41.5, plts 220, wbc 7.7 TSH 2.111 Hgb A1C 5.4  2V CXR :  Mild cardiomegaly.  No active disease.  Echo today EF 40-45% diffuse hypokinesis, Possibly disproportionate hypokinesis of the   inferior myocardium; consistent with ischemia in the distribution   of the right coronary artery. Features are consistent with a   pseudonormal left ventricular filling pattern, with concomitant   abnormal relaxation and increased filling pressure (grade 2   diastolic dysfunction). LA mildly dilated.   No orthostatic hypotension today.   Currently pt is without complaints, using computer service to interpret.  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  No chest pain with this and no SOB.  He has Rt shoulder pain due to repetitive activity.  He does have hs of 2-3 weeks ago having flu symptoms with fever off and on and cough.  The cough had not cleared so he went to his PCP. No awareness of heart beat.  No syncope.  Only dizziness was yesterday.  No FH of CAD.  Denies blood in stool or urine.     Past Medical History:  Diagnosis Date  . High cholesterol   . Hypertension     History reviewed. No pertinent surgical history.   Home Medications:  Prior to Admission medications   Medication Sig Start Date End Date Taking? Authorizing Provider  acetaminophen (TYLENOL) 500 MG tablet Take 1 tablet (500 mg total) by mouth every 6 (six) hours as needed. 06/08/18  Yes Wendee Beavers, DO  amLODipine (NORVASC) 5 MG tablet Take 1 tablet (5 mg total) by mouth daily. 11/28/17  Yes Wendee Beavers, DO  ibuprofen (ADVIL,MOTRIN) 800 MG tablet Take 800 mg by mouth every 8 (eight) hours as needed.   Yes [provider]  Multiple Vitamins-Minerals (MULTIVITAMIN ADULT) CHEW Chew 1 tablet by mouth daily. 06/08/18  Yes McMullen, David J, DO  PATADAY 0.2 % SOLN INSTILL 2 DROPS INTO EACH EYE DAILY Patient taking differently: Place 2  drops into both eyes daily.  08/23/16  Yes McKeag, Janine Ores, MD  omeprazole (PRILOSEC) 40 MG capsule Take 1 capsule (40 mg total) by mouth daily. Patient not taking: Reported on 12/01/2018 11/30/18   Wendee Beavers, DO    Inpatient Medications: Scheduled Meds: . enoxaparin (LOVENOX) injection  40 mg Subcutaneous Q24H  . olopatadine  1 drop Both Eyes BID  . pantoprazole  80 mg Oral Daily  . polyethylene glycol  17 g Oral Daily  . senna  1 tablet Oral BID  . sodium chloride flush  3 mL Intravenous Q12H   Continuous Infusions: . sodium chloride     PRN Meds: sodium chloride, acetaminophen, sodium chloride flush  Allergies:   No Known  Allergies  Social History:   Social History   Socioeconomic History  . Marital status: Married    Spouse name: Not on file  . Number of children: Not on file  . Years of education: Not on file  . Highest education level: Not on file  Occupational History  . Not on file  Social Needs  . Financial resource strain: Not on file  . Food insecurity:    Worry: Not on file    Inability: Not on file  . Transportation needs:    Medical: Not on file    Non-medical: Not on file  Tobacco Use  . Smoking status: Never Smoker  . Smokeless tobacco: Never Used  Substance and Sexual Activity  . Alcohol use: Never    Alcohol/week: 0.0 standard drinks    Frequency: Never  . Drug use: Never  . Sexual activity: Not on file  Lifestyle  . Physical activity:    Days per week: Not on file    Minutes per session: Not on file  . Stress: Not on file  Relationships  . Social connections:    Talks on phone: Not on file    Gets together: Not on file    Attends religious service: Not on file    Active member of club or organization: Not on file    Attends meetings of clubs or organizations: Not on file    Relationship status: Not on file  . Intimate partner violence:    Fear of current or ex partner: Not on file    Emotionally abused: Not on file    Physically abused: Not on file    Forced sexual activity: Not on file  Other Topics Concern  . Not on file  Social History Narrative  . Not on file    Family History:   History reviewed. No pertinent family history. pt denies any cardiac hx in parents or siblings.   ROS:  Please see the history of present illness.  General:no colds or fevers, no weight changes Skin:no rashes or ulcers HEENT:no blurred vision, no congestion CV:see HPI PUL:see HPI GI:no diarrhea constipation or melena, no indigestion GU:no hematuria, no dysuria MS:no joint pain, no claudication Neuro:no syncope, + lightheadedness yesterday  Endo:no diabetes, no thyroid  disease  All other ROS reviewed and negative.     Physical Exam/Data:   Vitals:   12/01/18 0625 12/01/18 0829 12/01/18 1033 12/01/18 1626  BP: (!) 150/89 134/89 134/89 131/80  Pulse: 87 73 73 61  Resp: 20 18 18  (!) 22  Temp: 98.5 F (36.9 C)  98.5 F (36.9 C)   TempSrc: Oral  Oral   SpO2: 100% 100%  100%  Weight:   85 kg   Height:   6' (1.829 m)  Intake/Output Summary (Last 24 hours) at 12/01/2018 1735 Last data filed at 12/01/2018 1730 Gross per 24 hour  Intake 540 ml  Output 300 ml  Net 240 ml   Filed Weights   11/30/18 1131 12/01/18 1033  Weight: 85 kg 85 kg   Body mass index is 25.41 kg/m.  General:  Well nourished, well developed, in no acute distress HEENT: normal Lymph: no adenopathy Neck: no JVD Endocrine:  No thryomegaly Vascular: No carotid bruits; pedal pulses 2+ bilaterally  Cardiac:  normal S1, S2; RRR; no murmur, gallup rub or click  Lungs:  clear to auscultation bilaterally, no wheezing, rhonchi or rales  Abd: soft, nontender, no hepatomegaly  Ext: no edema Musculoskeletal:  No deformities, BUE and BLE strength normal and equal Skin: warm and dry  Neuro:  Alert and Oriented X 3 MAE, follows commands no focal abnormalities noted Psych:  Normal affect    Relevant CV Studies: Echo 12/01/18 Study Conclusions  - Left ventricle: The cavity size was at the upper limits of   normal. Systolic function was mildly to moderately reduced. The   estimated ejection fraction was in the range of 40% to 45%.   Diffuse hypokinesis. Possibly disproportionate hypokinesis of the   inferior myocardium; consistent with ischemia in the distribution   of the right coronary artery. Features are consistent with a   pseudonormal left ventricular filling pattern, with concomitant   abnormal relaxation and increased filling pressure (grade 2   diastolic dysfunction). - Left atrium: The atrium was mildly dilated.  Laboratory Data:  Chemistry Recent Labs  Lab  11/30/18 1230 12/01/18 0756  NA 136 136  K 3.9 3.7  CL 101 104  CO2 23 22  GLUCOSE 107* 140*  BUN 13 13  CREATININE 0.91 0.91  CALCIUM 9.2 9.1  GFRNONAA >60 >60  GFRAA >60 >60  ANIONGAP 12 10    Recent Labs  Lab 11/30/18 1230  PROT 7.8  ALBUMIN 4.1  AST 27  ALT 26  ALKPHOS 78  BILITOT 0.9   Hematology Recent Labs  Lab 11/30/18 1230 12/01/18 0756  WBC 7.7 7.6  RBC 5.01 4.93  HGB 14.3 13.6  HCT 41.5 40.6  MCV 82.8 82.4  MCH 28.5 27.6  MCHC 34.5 33.5  RDW 12.6 12.8  PLT 220 215   Cardiac Enzymes Recent Labs  Lab 11/30/18 2003 12/01/18 0044 12/01/18 0756  TROPONINI <0.03 <0.03 <0.03   No results for input(s): TROPIPOC in the last 168 hours.  BNPNo results for input(s): BNP, PROBNP in the last 168 hours.  DDimer No results for input(s): DDIMER in the last 168 hours.  Radiology/Studies:  Dg Chest 2 View  Result Date: 11/30/2018 CLINICAL DATA:  Chest pain EXAM: CHEST - 2 VIEW COMPARISON:  None. FINDINGS: Mild cardiomegaly. No confluent airspace opacities, effusions or edema. No acute bony abnormality. IMPRESSION: Mild cardiomegaly.  No active disease. Electronically Signed   By: Charlett Nose M.D.   On: 11/30/2018 13:18    Assessment and Plan:   1. Near syncope with hypotension and bradycardia --neg MI  2. LV dysfunction with EF 40-45% - may be from flu like symptoms 2-3 weeks ago, could be from HTN with G2DD.  Dr. Tresa Endo to see- possible cardiac cath to eval Coronary arteries with inf lat EKG changes and increased dysfunction on echo of inf. wall. Add ACE and BB but will have Dr. Bishop Limbo see first for recommendations. Will hold ACE until after cath.   Cath will be Thursday  due to holiday.   3. NSVT 7 beats. Keep K+ > 4 and Mg+ >2  Add BB 4. HTN with G2DD BP stable now on amlodipine but would change to ACE and BB 5. GERD -  Continue prilosec.  6. HLD will add lipitor 40 mg   Dr. Tresa EndoKelly and I through interpreter discussed risks of cath.         For  questions or updates, please contact [facility] Please consult [email or web address] for contact info under   Signed, Nada BoozerLaura Ingold, NP  12/01/2018 5:35 PM    Patient seen and examined. Agree with assessment and plan.  Adam Hebert is a 52 year old [place] male who is being seen for evaluation of presyncope associated with hypotension and transient bradycardia.  Has a history of hypertension, GERD, hyperlipidemia, and was admitted knowing an episode of presyncope.  He denies any episodes of chest tightness.  Laboratory has revealed sinus rhythm with sinus arrhythmia, PVCs, and an episode of 7 beats of nonsustained VT.  The patient's ECGs have shown dynamic changes in the inferolateral leads with T wave inversion which ultimately have improved.  He feels better today.  An echo Doppler has shown however reduced EF at 40 to 45% with more pronounced inferior hypokinesis which is consistent with his ECG findings in the inferolateral leads suggestive of ischemia.  I had a long discussion with the patient.  I also spoke with the interpreter and had a discussion back-and-forth with the patient and interpreter.  With his dynamic EKG changes, abnormal echo Doppler study, I have recommended definitive evaluation with cardiac catheterization.  I discussed the procedure in detail. I have reviewed the risks, indications, and alternatives to cardiac catheterization, possible angioplasty, and stenting with the patient. Risks include but are not limited to bleeding, infection, vascular injury, stroke, myocardial infection, arrhythmia, kidney injury, radiation-related injury in the case of prolonged fluoroscopy use, emergency cardiac surgery, and death. The patient understands the risks of serious complication is 1-2 in 1000 with diagnostic cardiac cath and 1-2% or less with angioplasty/stenting.  Since tomorrow is a holiday in the Cath Lab is only open for emergent procedures, we will tentatively set up this catheterization  procedure on December 03, 2018.  We will add low-dose beta-blocker therapy.  Follow-up ECG in a.m.   Lennette Biharihomas A. Sabina Beavers, MD, [facility] 12/01/2018 6:20 PM

## 2018-12-01 NOTE — Progress Notes (Signed)
Patient had 7 beats VTach. BP 157/102 HR 79 T 98.8 O2 sat 100 on room air. Pt asymptomatic,denies any chest pain. MD on call notified. No new orders. Imran Nuon, Drinda Butts, Charity fundraiser

## 2018-12-01 NOTE — Progress Notes (Signed)
OT Cancellation Note  Patient Details Name: Adam Hebert MRN: [id number] DOB: 11-05-1966   Cancelled Treatment:    Reason Eval/Treat Not Completed: OT screened, no needs identified, will sign off. Pt performing at independent level for mobility during PT evaluation. Pt and pt family reporting that he is at baseline for ADLs and functional mobility.  Whittley Carandang M Tyrica Afzal Matelyn Antonelli MSOT, OTR/L Acute Rehab Pager: [phone] Office: [phone] 12/01/2018, 4:15 PM

## 2018-12-01 NOTE — Progress Notes (Signed)
Family Medicine Teaching Service Daily Progress Note Intern Pager: [phone]  Patient name: Adam Hebert Medical record number: [id number] Date of birth: Mar 30, 1966 Age: 52 y.o. Gender: male  Primary Care Provider: Wendee Beavers, DO Consultants: None Code Status: Full  Pt Overview and Major Events to Date:  Adam Hebert is a 52 y.o. male presenting with near syncope . PMH is significant for HTN, HLD.  Assessment and Plan:  Near-syncope likely secondary to dehydration/poor nutrition: in the setting of bradycardia to high 40s. Associated with episodic hypotension to 89/65, which resolved on its own. Since admission and administration of fluids his BP has been normotensive to hypertensive as we have held his home dose of amlodipine. Cardiac work up has been unremarkable so far as EKG shows NSR with LVH and left axis deviation but new T-wave inversions have resolved. Troponin negative <0.03 x 3 with no chest pain, SOB, or nausea today.  No cardiac history other than HTN and HLD. No tobacco use.  No previous echo. TSH WNL at 2.11.  - cont to monitor on telemetry - encourage PO fluids; at least 64oz per day - echo pending - check orthostatics - PT/OT eval  Productive cough 3 weeks duration. No leukocytosis.  Afebrile since admission and flu testing negative. No SOB, difficulty breathing, or subjective. - flu negative - continue prilosec  Right shoulder pain - worse over the past 3 weeks. 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  He has noted multiple coworkers with a similar shoulder pain. Physical exam appears consistent with musculoskeletal shoulder pain. Considered aortic dissection, though no widened mediastinum and patient is non-toxic appearing on admission. Responded well to tylenol. -Tylenol PRN for pain -Continue to monitor - can generate a letter recommending two weeks of rest from work that requires  that repetitive motion  Nausea/Constipation - Nausea is worse before he eats, when he is hungry. Improves with eating. No episodes of emesis. Last BM was 2 weeks ago and there was a small amount of streaking blood on the stool.  Hemoglobin is 14.3. - miralax 17g QD, titerate for 1-2 soft stools per day  HLD - Last lipid panel from 06/08/2018 showed cholesterol 230, HDL 37, LDL 154, Triglycerides 195. Current 10 year risk: 3.8%. -Follow-up lipid panel   FEN/GI: heart healthy PPx: Lovenox  Disposition: Home following completion of workup  Subjective:  Patient states today he feels well. His shoulder pain has resolved with Tylenol and he denies any chest pain, SOB, or dizziness upon standing up and walking since admission. He has plenty of fluids and feels much better. Taking good PO.  Objective: Temp:  [98.2 F (36.8 C)-98.9 F (37.2 C)] 98.5 F (36.9 C) (12/31 0625) Pulse Rate:  [54-87] 87 (12/31 0625) Resp:  [14-29] 20 (12/31 0625) BP: (89-160)/(61-107) 150/89 (12/31 0625) SpO2:  [100 %] 100 % (12/31 0625) Weight:  [85 kg-85.8 kg] 85 kg (12/30 1131) Physical Exam: Gen: Alert and Oriented x 3, NAD HEENT: Normocephalic, atraumatic CV: RRR, no murmurs, normal S1, S2 split Resp: CTAB, no wheezing, rales, or rhonchi, comfortable work of breathing Abd: non-distended, non-tender, soft, +bs in all four quadrants Ext: no clubbing, cyanosis, or edema; +2 distal pulses Neuro: No gross deficits Skin: warm, dry, intact, no rashes  Laboratory: Recent Labs  Lab 11/30/18 1230  WBC 7.7  HGB 14.3  HCT 41.5  PLT 220   Recent Labs  Lab 11/30/18 1230  NA 136  K 3.9  CL 101  CO2 23  BUN 13  CREATININE 0.91  CALCIUM 9.2  PROT 7.8  BILITOT 0.9  ALKPHOS 78  ALT 26  AST 27  GLUCOSE 107*   Troponin I: <0.03 x 3 Lipid Panel: LDL 138, Tot. Chol. 222 Influenza PCR: negative A1c: 5.4 TSH: 2.111 U/A: negative LA: 1.42  Imaging/Diagnostic Tests: CXR: Mild Cardiomegaly, no  active disease Echo: pending EKG 12/31: NSR, LVH with left axis deviation  [name], Danuel Felicetti, DO 12/01/2018, 8:14 AM PGY-2, [facility] FPTS Intern pager: [phone], text pages welcome

## 2018-12-01 NOTE — H&P (View-Only) (Signed)
Cardiology Consultation:   Patient ID: Adam Hebert MRN: [id number]; DOB: [birth year]  Admit date: 11/30/2018 Date of Consult: 12/01/2018  Primary Care Provider: Wendee Beavers, DO Primary Cardiologist: No primary care provider on file. NEW  Primary Electrophysiologist:  None   Patient Profile:   Adam Hebert is a 52 y.o. male from [place] and speaks Arabic with a hx of HTN, HLD who is being seen today for the evaluation of near syncope and hypotension with admit for hypotension and bradycardia in the 40s at the request of Dr. Jennette Kettle.  History of Present Illness:   Adam Hebert with hx HTN, reflux and HLD, was at his PCP for cough and complained of feeling lightheaded on Exam table.  Lasted a couple of min and resolved.  Then had second episode while standing up.  Initial pulse in office 72 with BP 125/72  And initial EKG with SR at 65, then EKG at 1231 with SB at 53 LVH and T wave inversion in inf. Lat leads.   Today's EKGs with SR and LVH and upright T waves.  I personally reviewed all EKGs.    BP with HR in 50s at 89/65 then BP back up 139/98.  Telemetry:  Telemetry was personally reviewed and demonstrates:  SR with sinus arrhythmia and PVCs and one episode of 7 beats of NSVT.   Troponin neg <0.03 X 3 Na 136, K+ 3.9, BUN 13, Cr 0.91 LFTs normal  Hgb 14.3, Hct 41.5, plts 220, wbc 7.7 TSH 2.111 Hgb A1C 5.4  2V CXR :  Mild cardiomegaly.  No active disease.  Echo today EF 40-45% diffuse hypokinesis, Possibly disproportionate hypokinesis of the   inferior myocardium; consistent with ischemia in the distribution   of the right coronary artery. Features are consistent with a   pseudonormal left ventricular filling pattern, with concomitant   abnormal relaxation and increased filling pressure (grade 2   diastolic dysfunction). LA mildly dilated.   No orthostatic hypotension today.   Currently pt is without complaints, using computer service to interpret.  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  No chest pain with this and no SOB.  He has Rt shoulder pain due to repetitive activity.  He does have hs of 2-3 weeks ago having flu symptoms with fever off and on and cough.  The cough had not cleared so he went to his PCP. No awareness of heart beat.  No syncope.  Only dizziness was yesterday.  No FH of CAD.  Denies blood in stool or urine.     Past Medical History:  Diagnosis Date  . High cholesterol   . Hypertension     History reviewed. No pertinent surgical history.   Home Medications:  Prior to Admission medications   Medication Sig Start Date End Date Taking? Authorizing Provider  acetaminophen (TYLENOL) 500 MG tablet Take 1 tablet (500 mg total) by mouth every 6 (six) hours as needed. 06/08/18  Yes Wendee Beavers, DO  amLODipine (NORVASC) 5 MG tablet Take 1 tablet (5 mg total) by mouth daily. 11/28/17  Yes Wendee Beavers, DO  ibuprofen (ADVIL,MOTRIN) 800 MG tablet Take 800 mg by mouth every 8 (eight) hours as needed.   Yes [provider]  Multiple Vitamins-Minerals (MULTIVITAMIN ADULT) CHEW Chew 1 tablet by mouth daily. 06/08/18  Yes McMullen, David J, DO  PATADAY 0.2 % SOLN INSTILL 2 DROPS INTO EACH EYE DAILY Patient taking differently: Place 2  drops into both eyes daily.  08/23/16  Yes McKeag, Janine Ores, MD  omeprazole (PRILOSEC) 40 MG capsule Take 1 capsule (40 mg total) by mouth daily. Patient not taking: Reported on 12/01/2018 11/30/18   Wendee Beavers, DO    Inpatient Medications: Scheduled Meds: . enoxaparin (LOVENOX) injection  40 mg Subcutaneous Q24H  . olopatadine  1 drop Both Eyes BID  . pantoprazole  80 mg Oral Daily  . polyethylene glycol  17 g Oral Daily  . senna  1 tablet Oral BID  . sodium chloride flush  3 mL Intravenous Q12H   Continuous Infusions: . sodium chloride     PRN Meds: sodium chloride, acetaminophen, sodium chloride flush  Allergies:   No Known  Allergies  Social History:   Social History   Socioeconomic History  . Marital status: Married    Spouse name: Not on file  . Number of children: Not on file  . Years of education: Not on file  . Highest education level: Not on file  Occupational History  . Not on file  Social Needs  . Financial resource strain: Not on file  . Food insecurity:    Worry: Not on file    Inability: Not on file  . Transportation needs:    Medical: Not on file    Non-medical: Not on file  Tobacco Use  . Smoking status: Never Smoker  . Smokeless tobacco: Never Used  Substance and Sexual Activity  . Alcohol use: Never    Alcohol/week: 0.0 standard drinks    Frequency: Never  . Drug use: Never  . Sexual activity: Not on file  Lifestyle  . Physical activity:    Days per week: Not on file    Minutes per session: Not on file  . Stress: Not on file  Relationships  . Social connections:    Talks on phone: Not on file    Gets together: Not on file    Attends religious service: Not on file    Active member of club or organization: Not on file    Attends meetings of clubs or organizations: Not on file    Relationship status: Not on file  . Intimate partner violence:    Fear of current or ex partner: Not on file    Emotionally abused: Not on file    Physically abused: Not on file    Forced sexual activity: Not on file  Other Topics Concern  . Not on file  Social History Narrative  . Not on file    Family History:   History reviewed. No pertinent family history. pt denies any cardiac hx in parents or siblings.   ROS:  Please see the history of present illness.  General:no colds or fevers, no weight changes Skin:no rashes or ulcers HEENT:no blurred vision, no congestion CV:see HPI PUL:see HPI GI:no diarrhea constipation or melena, no indigestion GU:no hematuria, no dysuria MS:no joint pain, no claudication Neuro:no syncope, + lightheadedness yesterday  Endo:no diabetes, no thyroid  disease  All other ROS reviewed and negative.     Physical Exam/Data:   Vitals:   12/01/18 0625 12/01/18 0829 12/01/18 1033 12/01/18 1626  BP: (!) 150/89 134/89 134/89 131/80  Pulse: 87 73 73 61  Resp: 20 18 18  (!) 22  Temp: 98.5 F (36.9 C)  98.5 F (36.9 C)   TempSrc: Oral  Oral   SpO2: 100% 100%  100%  Weight:   85 kg   Height:   6' (1.829 m)  Intake/Output Summary (Last 24 hours) at 12/01/2018 1735 Last data filed at 12/01/2018 1730 Gross per 24 hour  Intake 540 ml  Output 300 ml  Net 240 ml   Filed Weights   11/30/18 1131 12/01/18 1033  Weight: 85 kg 85 kg   Body mass index is 25.41 kg/m.  General:  Well nourished, well developed, in no acute distress HEENT: normal Lymph: no adenopathy Neck: no JVD Endocrine:  No thryomegaly Vascular: No carotid bruits; pedal pulses 2+ bilaterally  Cardiac:  normal S1, S2; RRR; no murmur, gallup rub or click  Lungs:  clear to auscultation bilaterally, no wheezing, rhonchi or rales  Abd: soft, nontender, no hepatomegaly  Ext: no edema Musculoskeletal:  No deformities, BUE and BLE strength normal and equal Skin: warm and dry  Neuro:  Alert and Oriented X 3 MAE, follows commands no focal abnormalities noted Psych:  Normal affect    Relevant CV Studies: Echo 12/01/18 Study Conclusions  - Left ventricle: The cavity size was at the upper limits of   normal. Systolic function was mildly to moderately reduced. The   estimated ejection fraction was in the range of 40% to 45%.   Diffuse hypokinesis. Possibly disproportionate hypokinesis of the   inferior myocardium; consistent with ischemia in the distribution   of the right coronary artery. Features are consistent with a   pseudonormal left ventricular filling pattern, with concomitant   abnormal relaxation and increased filling pressure (grade 2   diastolic dysfunction). - Left atrium: The atrium was mildly dilated.  Laboratory Data:  Chemistry Recent Labs  Lab  11/30/18 1230 12/01/18 0756  NA 136 136  K 3.9 3.7  CL 101 104  CO2 23 22  GLUCOSE 107* 140*  BUN 13 13  CREATININE 0.91 0.91  CALCIUM 9.2 9.1  GFRNONAA >60 >60  GFRAA >60 >60  ANIONGAP 12 10    Recent Labs  Lab 11/30/18 1230  PROT 7.8  ALBUMIN 4.1  AST 27  ALT 26  ALKPHOS 78  BILITOT 0.9   Hematology Recent Labs  Lab 11/30/18 1230 12/01/18 0756  WBC 7.7 7.6  RBC 5.01 4.93  HGB 14.3 13.6  HCT 41.5 40.6  MCV 82.8 82.4  MCH 28.5 27.6  MCHC 34.5 33.5  RDW 12.6 12.8  PLT 220 215   Cardiac Enzymes Recent Labs  Lab 11/30/18 2003 12/01/18 0044 12/01/18 0756  TROPONINI <0.03 <0.03 <0.03   No results for input(s): TROPIPOC in the last 168 hours.  BNPNo results for input(s): BNP, PROBNP in the last 168 hours.  DDimer No results for input(s): DDIMER in the last 168 hours.  Radiology/Studies:  Dg Chest 2 View  Result Date: 11/30/2018 CLINICAL DATA:  Chest pain EXAM: CHEST - 2 VIEW COMPARISON:  None. FINDINGS: Mild cardiomegaly. No confluent airspace opacities, effusions or edema. No acute bony abnormality. IMPRESSION: Mild cardiomegaly.  No active disease. Electronically Signed   By: Charlett Nose M.D.   On: 11/30/2018 13:18    Assessment and Plan:   1. Near syncope with hypotension and bradycardia --neg MI  2. LV dysfunction with EF 40-45% - may be from flu like symptoms 2-3 weeks ago, could be from HTN with G2DD.  Dr. Tresa Endo to see- possible cardiac cath to eval Coronary arteries with inf lat EKG changes and increased dysfunction on echo of inf. wall. Add ACE and BB but will have Dr. Bishop Limbo see first for recommendations. Will hold ACE until after cath.   Cath will be Thursday  due to holiday.   3. NSVT 7 beats. Keep K+ > 4 and Mg+ >2  Add BB 4. HTN with G2DD BP stable now on amlodipine but would change to ACE and BB 5. GERD -  Continue prilosec.  6. HLD will add lipitor 40 mg   Dr. Tresa EndoKelly and I through interpreter discussed risks of cath.         For  questions or updates, please contact [facility] Please consult [email or web address] for contact info under   Signed, Nada BoozerLaura Ingold, NP  12/01/2018 5:35 PM    Patient seen and examined. Agree with assessment and plan.  Adam Hebert is a 52 year old [place] male who is being seen for evaluation of presyncope associated with hypotension and transient bradycardia.  Has a history of hypertension, GERD, hyperlipidemia, and was admitted knowing an episode of presyncope.  He denies any episodes of chest tightness.  Laboratory has revealed sinus rhythm with sinus arrhythmia, PVCs, and an episode of 7 beats of nonsustained VT.  The patient's ECGs have shown dynamic changes in the inferolateral leads with T wave inversion which ultimately have improved.  He feels better today.  An echo Doppler has shown however reduced EF at 40 to 45% with more pronounced inferior hypokinesis which is consistent with his ECG findings in the inferolateral leads suggestive of ischemia.  I had a long discussion with the patient.  I also spoke with the interpreter and had a discussion back-and-forth with the patient and interpreter.  With his dynamic EKG changes, abnormal echo Doppler study, I have recommended definitive evaluation with cardiac catheterization.  I discussed the procedure in detail. I have reviewed the risks, indications, and alternatives to cardiac catheterization, possible angioplasty, and stenting with the patient. Risks include but are not limited to bleeding, infection, vascular injury, stroke, myocardial infection, arrhythmia, kidney injury, radiation-related injury in the case of prolonged fluoroscopy use, emergency cardiac surgery, and death. The patient understands the risks of serious complication is 1-2 in 1000 with diagnostic cardiac cath and 1-2% or less with angioplasty/stenting.  Since tomorrow is a holiday in the Cath Lab is only open for emergent procedures, we will tentatively set up this catheterization  procedure on December 03, 2018.  We will add low-dose beta-blocker therapy.  Follow-up ECG in a.m.   Lennette Biharihomas A. Borna Wessinger, MD, [facility] 12/01/2018 6:20 PM

## 2018-12-02 DIAGNOSIS — R001 Bradycardia, unspecified: Secondary | ICD-10-CM

## 2018-12-02 DIAGNOSIS — R55 Syncope and collapse: Secondary | ICD-10-CM

## 2018-12-02 DIAGNOSIS — I959 Hypotension, unspecified: Secondary | ICD-10-CM

## 2018-12-02 DIAGNOSIS — I5020 Unspecified systolic (congestive) heart failure: Secondary | ICD-10-CM

## 2018-12-02 LAB — BASIC METABOLIC PANEL
Anion gap: 7 (ref 5–15)
BUN: 14 mg/dL (ref 6–20)
CO2: 27 mmol/L (ref 22–32)
Calcium: 9 mg/dL (ref 8.9–10.3)
Chloride: 103 mmol/L (ref 98–111)
Creatinine, Ser: 0.96 mg/dL (ref 0.61–1.24)
GFR calc Af Amer: >60 mL/min (ref >60)
GFR calc non Af Amer: >60 mL/min (ref >60)
GLUCOSE: 99 mg/dL (ref 70–99)
Potassium: 3.7 mmol/L (ref 3.5–5.1)
Sodium: 137 mmol/L (ref 135–145)

## 2018-12-02 LAB — CBC
HCT: 40.2 % (ref 39.0–52.0)
Hemoglobin: 13.5 g/dL (ref 13.0–17.0)
MCH: 27.6 pg (ref 26.0–34.0)
MCHC: 33.6 g/dL (ref 30.0–36.0)
MCV: 82.2 fL (ref 80.0–100.0)
Platelets: 208 10*3/uL (ref 150–400)
RBC: 4.89 MIL/uL (ref 4.22–5.81)
RDW: 12.7 % (ref 11.5–15.5)
WBC: 8 10*3/uL (ref 4.0–10.5)
nRBC: 0 % (ref 0.0–0.2)

## 2018-12-02 MED ORDER — SODIUM CHLORIDE 0.9% FLUSH: 3.0000 mL | INTRAVENOUS | Status: DC | PRN

## 2018-12-02 MED ORDER — SODIUM CHLORIDE 0.9 % IV SOLN: 250.0000 mL | INTRAVENOUS | Status: DC | PRN

## 2018-12-02 MED ORDER — SODIUM CHLORIDE 0.9 % WEIGHT BASED INFUSION: 3.0000 mL/kg/h | INTRAVENOUS | Status: AC

## 2018-12-02 MED ORDER — ENOXAPARIN SODIUM 40 MG/0.4ML ~~LOC~~ SOLN: 40.0000 mg | SUBCUTANEOUS | Status: DC

## 2018-12-02 MED ORDER — ASPIRIN 81 MG PO CHEW
81.0000 mg | CHEWABLE_TABLET | ORAL | Status: AC
  Administered 2018-12-03: 81 mg via ORAL
  Filled 2018-12-02: qty 1

## 2018-12-02 MED ORDER — SODIUM CHLORIDE 0.9% FLUSH
3.0000 mL | Freq: Two times a day (BID) | INTRAVENOUS | Status: DC
  Administered 2018-12-02: 3 mL via INTRAVENOUS

## 2018-12-02 MED ORDER — SODIUM CHLORIDE 0.9 % WEIGHT BASED INFUSION: 1.0000 mL/kg/h | INTRAVENOUS | Status: DC

## 2018-12-02 NOTE — Progress Notes (Signed)
Family Medicine Teaching Service Daily Progress Note Intern Pager: [phone]  Patient name: Adam Hebert Medical record number: [id number] Date of birth: 1966-10-10 Age: 53 y.o. Gender: male  Primary Care Provider: Wendee Beavers, DO Consultants: cardiology Code Status: full  Pt Overview and Major Events to Date:  Adam Hebert is a 53 y.o. male presenting with symptomatic bradycardia now found to have HFrEF. PMH is significant for HTN, HLD.  Assessment and Plan: Adam Hebert is a 53 y.o. male presenting with symptomatic bradycardia now found to have HFrEF. PMH is significant for HTN, HLD.  HFrEF  Symptomatic bradycardia  LVH: Acute and intermittent.  Initially bradycardic in the high 40s now intermittently between 50 and 70s. EKG NSR at 74 bpm without ST changes or T wave abnormalities, LVH, QTc 426 without heart block.  Troponins negative x3.  Unlikely ACS.  TTE significant for EF 40-45% with diffuse hypokinesis, G2DD, and a mildly dilated LA. patient has had a 7 beat NSVT on telemetry. - Cardiology consulted, appreciate recommendations plans for heart cath on 12/03/2018 - Telemetry, will likely need Holter monitor at discharge - Maintain potassium >4 and magnesium >2 - Continue Lopressor 12.5 mg twice daily  Presyncope: Acute and intermittent likely related to bradycardia as mentioned above.  No LOC or falls.  Neuro exam grossly intact.  Orthostatics negative. - PT/OT consulted, appreciate recommendations - See plan for symptomatic bradycardia  Hypotension: Acute and seems to be related to symptomatic bradycardia.  Has maintained normotensive BP during admission. - See plan for symptomatic bradycardia  Nonproductive cough: Subacute.  Improved.  No signs of pneumonia or COPD exacerbation. - Continue Prilosec 80 mg daily  Hyperlipidemia: Chronic.  Stable.  Secondary prevention. - Continue Lipitor 40 mg daily  Right shoulder pain: Subacute.   Consistent with MSK nature.  No signs of ACS based on work-up.  Occupation with [employer] likely contributing to pain.  Suspect this may be rotator cuff related. - Follow-up outpatient  FEN/GI: Heart healthy, n.p.o. at midnight PPx: Lovenox, SCDs for planned cath  Disposition: Pending heart cath for HFrEF based on recent echo.  Anticipate discharge home pending cardiology recommendations.  Will likely need Holter monitor setup at discharge.  Subjective:  Patient reports resolution of symptoms.  He denies chest pain, shortness of breath, palpitations.  He has not felt dizzy or lightheaded in the last 24 hours.  He understands the plan for the cardiac cath tomorrow and has no further questions.  Objective: Temp:  [97.4 F (36.3 C)-98.5 F (36.9 C)] 98.4 F (36.9 C) (01/01 0341) Pulse Rate:  [61-80] 80 (01/01 0341) Resp:  [16-22] 18 (01/01 0341) BP: (131-155)/(80-98) 155/95 (01/01 0341) SpO2:  [98 %-100 %] 100 % (01/01 0341) Weight:  [85 kg] 85 kg (01/01 0355) Physical Exam: General: well nourished, well developed, NAD with non-toxic appearance HEENT: normocephalic, atraumatic, moist mucous membranes Neck: supple, non-tender without lymphadenopathy, no JVD Cardiovascular: regular rate and rhythm without murmurs, rubs, or gallops Lungs: clear to auscultation bilaterally with normal work of breathing Abdomen: soft, non-tender, non-distended, normoactive bowel sounds Skin: warm, dry, no rashes or lesions, cap refill < 2 seconds Extremities: warm and well perfused, normal tone, no edema  Laboratory: Recent Labs  Lab 11/30/18 1230 12/01/18 0756 12/02/18 0550  WBC 7.7 7.6 8.0  HGB 14.3 13.6 13.5  HCT 41.5 40.6 40.2  PLT 220 215 208   Recent Labs  Lab 11/30/18 1230 12/01/18 0756 12/02/18 0550  NA 136 136 137  K  3.9 3.7 3.7  CL 101 104 103  CO2 23 22 27   BUN 13 13 14   CREATININE 0.91 0.91 0.96  CALCIUM 9.2 9.1 9.0  PROT 7.8  --   --   BILITOT 0.9  --   --   ALKPHOS  78  --   --   ALT 26  --   --   AST 27  --   --   GLUCOSE 107* 140* 99   Lipid panel: Cholesterol 222, TGY 251, HDL 34, LDL 138 Troponin I: Negative x3 Influenza panel: Negative HIV antibody: Nonreactive Hemoglobin A1c: 5.4 Phosphorus: 4.2 (WNL) Magnesium: 2.0 (WNL) Lipase: 16 TSH: 2.111 (WNL) I-STAT lactic acid: 1.42 Urinalysis: Unremarkable Urine culture: No growth  Imaging/Diagnostic Tests: CHEST - 2 VIEW (11/30/2018) IMPRESSION: Mild cardiomegaly.  No active disease.  ECHOCARDIOGRAM COMPLETE (12/01/2018) Study Conclusions Left ventricle: The cavity size was at the upper limits of normal. Systolic function was mildly to moderately reduced. The estimated ejection fraction was in the range of 40% to 45%. Diffuse hypokinesis. Possibly disproportionate hypokinesis of the inferior myocardium; consistent with ischemia in the distribution of the right coronary artery. Features are consistent with a pseudonormal left ventricular filling pattern, with concomitant abnormal relaxation and increased filling pressure (grade 2 diastolic dysfunction). Left atrium was mildly dilated.    Wendee Beavers, DO 12/02/2018, 8:28 AM PGY-3, [facility] FPTS Intern pager: [phone], text pages welcome

## 2018-12-03 ENCOUNTER — Encounter (HOSPITAL_COMMUNITY): Payer: Self-pay | Admitting: Interventional Cardiology

## 2018-12-03 ENCOUNTER — Encounter (HOSPITAL_COMMUNITY)
Admission: EM | Disposition: A | Discharge status: Home / Self Care | Payer: Self-pay | Source: Ambulatory Visit | Attending: Family Medicine

## 2018-12-03 DIAGNOSIS — I472 Ventricular tachycardia: Secondary | ICD-10-CM

## 2018-12-03 DIAGNOSIS — I10 Essential (primary) hypertension: Secondary | ICD-10-CM

## 2018-12-03 DIAGNOSIS — I5021 Acute systolic (congestive) heart failure: Secondary | ICD-10-CM

## 2018-12-03 HISTORY — PX: LEFT HEART CATH AND CORONARY ANGIOGRAPHY: CATH118249

## 2018-12-03 LAB — CBC
HCT: 41.3 % (ref 39.0–52.0)
HEMOGLOBIN: 14.3 g/dL (ref 13.0–17.0)
MCH: 28.5 pg (ref 26.0–34.0)
MCHC: 34.6 g/dL (ref 30.0–36.0)
MCV: 82.4 fL (ref 80.0–100.0)
Platelets: 230 10*3/uL (ref 150–400)
RBC: 5.01 MIL/uL (ref 4.22–5.81)
RDW: 12.8 % (ref 11.5–15.5)
WBC: 7.8 10*3/uL (ref 4.0–10.5)
nRBC: 0 % (ref 0.0–0.2)

## 2018-12-03 LAB — BASIC METABOLIC PANEL
Anion gap: 10 (ref 5–15)
BUN: 17 mg/dL (ref 6–20)
CO2: 25 mmol/L (ref 22–32)
Calcium: 9.1 mg/dL (ref 8.9–10.3)
Chloride: 100 mmol/L (ref 98–111)
Creatinine, Ser: 1 mg/dL (ref 0.61–1.24)
GFR calc Af Amer: >60 mL/min (ref >60)
GFR calc non Af Amer: >60 mL/min (ref >60)
Glucose, Bld: 116 mg/dL — ABNORMAL HIGH (ref 70–99)
POTASSIUM: 3.9 mmol/L (ref 3.5–5.1)
Sodium: 135 mmol/L (ref 135–145)

## 2018-12-03 LAB — PROTIME-INR
INR: 1.05
Prothrombin Time: 13.6 seconds (ref 11.4–15.2)

## 2018-12-03 LAB — MAGNESIUM: Magnesium: 1.9 mg/dL (ref 1.7–2.4)

## 2018-12-03 SURGERY — LEFT HEART CATH AND CORONARY ANGIOGRAPHY
Anesthesia: LOCAL

## 2018-12-03 MED ORDER — VERAPAMIL HCL 2.5 MG/ML IV SOLN
INTRAVENOUS | Status: DC | PRN
  Administered 2018-12-03: 10 mL via INTRA_ARTERIAL

## 2018-12-03 MED ORDER — MIDAZOLAM HCL 2 MG/2ML IJ SOLN
INTRAMUSCULAR | Status: DC | PRN
  Administered 2018-12-03: 1 mg via INTRAVENOUS

## 2018-12-03 MED ORDER — HEPARIN SODIUM (PORCINE) 1000 UNIT/ML IJ SOLN
INTRAMUSCULAR | Status: DC | PRN
  Administered 2018-12-03: 4500 [IU] via INTRAVENOUS

## 2018-12-03 MED ORDER — LIVING BETTER WITH HEART FAILURE BOOK
Freq: Once | Status: AC
  Administered 2018-12-03: 1

## 2018-12-03 MED ORDER — SODIUM CHLORIDE 0.9 % IV SOLN
INTRAVENOUS | Status: AC
  Administered 2018-12-03: 13:00:00 via INTRAVENOUS

## 2018-12-03 MED ORDER — HEPARIN (PORCINE) IN NACL 1000-0.9 UT/500ML-% IV SOLN
INTRAVENOUS | Status: AC
  Filled 2018-12-03: qty 1000

## 2018-12-03 MED ORDER — IOHEXOL 350 MG/ML SOLN
INTRAVENOUS | Status: DC | PRN
  Administered 2018-12-03: 120 mL via INTRA_ARTERIAL

## 2018-12-03 MED ORDER — FENTANYL CITRATE (PF) 100 MCG/2ML IJ SOLN
INTRAMUSCULAR | Status: AC
  Filled 2018-12-03: qty 2

## 2018-12-03 MED ORDER — MAGNESIUM SULFATE 2 GM/50ML IV SOLN
2.0000 g | Freq: Once | INTRAVENOUS | Status: AC
  Administered 2018-12-03: 2 g via INTRAVENOUS
  Filled 2018-12-03: qty 50

## 2018-12-03 MED ORDER — SODIUM CHLORIDE 0.9% FLUSH
3.0000 mL | Freq: Two times a day (BID) | INTRAVENOUS | Status: DC
  Administered 2018-12-03 – 2018-12-05 (×4): 3 mL via INTRAVENOUS

## 2018-12-03 MED ORDER — SODIUM CHLORIDE 0.9 % WEIGHT BASED INFUSION
1.0000 mL/kg/h | INTRAVENOUS | Status: DC
  Administered 2018-12-03: 1 mL/kg/h via INTRAVENOUS

## 2018-12-03 MED ORDER — LIDOCAINE HCL (PF) 1 % IJ SOLN
INTRAMUSCULAR | Status: AC
  Filled 2018-12-03: qty 30

## 2018-12-03 MED ORDER — HEPARIN (PORCINE) IN NACL 1000-0.9 UT/500ML-% IV SOLN
INTRAVENOUS | Status: DC | PRN
  Administered 2018-12-03 (×2): 500 mL

## 2018-12-03 MED ORDER — SODIUM CHLORIDE 0.9 % IV SOLN: 250.0000 mL | INTRAVENOUS | Status: DC | PRN

## 2018-12-03 MED ORDER — ACETAMINOPHEN 325 MG PO TABS
650.0000 mg | ORAL_TABLET | ORAL | Status: DC | PRN
  Administered 2018-12-03: 16:00:00 650 mg via ORAL
  Filled 2018-12-03: qty 2

## 2018-12-03 MED ORDER — VERAPAMIL HCL 2.5 MG/ML IV SOLN
INTRAVENOUS | Status: AC
  Filled 2018-12-03: qty 2

## 2018-12-03 MED ORDER — ENOXAPARIN SODIUM 40 MG/0.4ML ~~LOC~~ SOLN
40.0000 mg | SUBCUTANEOUS | Status: DC
  Administered 2018-12-04 – 2018-12-05 (×2): 40 mg via SUBCUTANEOUS
  Filled 2018-12-03 (×2): qty 0.4

## 2018-12-03 MED ORDER — LOSARTAN POTASSIUM 50 MG PO TABS
25.0000 mg | ORAL_TABLET | Freq: Every day | ORAL | Status: DC
  Administered 2018-12-03 – 2018-12-04 (×2): 25 mg via ORAL
  Filled 2018-12-03 (×2): qty 1

## 2018-12-03 MED ORDER — SODIUM CHLORIDE 0.9 % WEIGHT BASED INFUSION
3.0000 mL/kg/h | INTRAVENOUS | Status: DC
  Administered 2018-12-03: 3 mL/kg/h via INTRAVENOUS

## 2018-12-03 MED ORDER — MIDAZOLAM HCL 2 MG/2ML IJ SOLN
INTRAMUSCULAR | Status: AC
  Filled 2018-12-03: qty 2

## 2018-12-03 MED ORDER — ONDANSETRON HCL 4 MG/2ML IJ SOLN: 4.0000 mg | Freq: Four times a day (QID) | INTRAMUSCULAR | Status: DC | PRN

## 2018-12-03 MED ORDER — SODIUM CHLORIDE 0.9% FLUSH: 3.0000 mL | INTRAVENOUS | Status: DC | PRN

## 2018-12-03 MED ORDER — FENTANYL CITRATE (PF) 100 MCG/2ML IJ SOLN
INTRAMUSCULAR | Status: DC | PRN
  Administered 2018-12-03: 25 ug via INTRAVENOUS

## 2018-12-03 MED ORDER — LIDOCAINE HCL (PF) 1 % IJ SOLN
INTRAMUSCULAR | Status: DC | PRN
  Administered 2018-12-03: 2 mL

## 2018-12-03 SURGICAL SUPPLY — 12 items
CATH 5FR JL3.5 JR4 ANG PIG MP (CATHETERS) ×1 IMPLANT
CATH LAUNCHER 5F RADR (CATHETERS) IMPLANT
CATHETER LAUNCHER 5F RADR (CATHETERS) ×2
DEVICE RAD COMP TR BAND LRG (VASCULAR PRODUCTS) ×1 IMPLANT
GLIDESHEATH SLEND A-KIT 6F 22G (SHEATH) ×1 IMPLANT
GUIDEWIRE INQWIRE 1.5J.035X260 (WIRE) IMPLANT
INQWIRE 1.5J .035X260CM (WIRE) ×2
KIT HEART LEFT (KITS) ×2 IMPLANT
PACK CARDIAC CATHETERIZATION (CUSTOM PROCEDURE TRAY) ×2 IMPLANT
SHEATH PROBE COVER 6X72 (BAG) ×1 IMPLANT
TRANSDUCER W/STOPCOCK (MISCELLANEOUS) ×2 IMPLANT
TUBING CIL FLEX 10 FLL-RA (TUBING) ×2 IMPLANT

## 2018-12-03 NOTE — Progress Notes (Signed)
[facility] BAND REMOVAL  LOCATION:    right radial  DEFLATED PER PROTOCOL:    Yes.    TIME BAND OFF / DRESSING APPLIED:    1400   SITE UPON ARRIVAL:    Level 0  SITE AFTER BAND REMOVAL:    Level 0  CIRCULATION SENSATION AND MOVEMENT:    Within Normal Limits   Yes.    COMMENTS:   TR Band removed per protocol, no bleeding, no hematoma, soft to touch, dressing applied C/D/I. Post removal instructions provided with patient's daughter and patient at bedside. Patient demonstrates understanding and verbally voices. Tolerated removal well. No s/s of distress noted or complaints voiced at this time. Call bell is in reach.

## 2018-12-03 NOTE — Interval H&P Note (Signed)
Cath Lab Visit (complete for each Cath Lab visit)  Clinical Evaluation Leading to the Procedure:   ACS: No.  Non-ACS:    Anginal Classification: CCS III  Anti-ischemic medical therapy: Minimal Therapy (1 class of medications)  Non-Invasive Test Results: No non-invasive testing performed  Prior CABG: No previous CABG      History and Physical Interval Note:  12/03/2018 9:16 AM  Adam Hebert  has presented today for surgery, with the diagnosis of unstable angina  The various methods of treatment have been discussed with the patient and family. After consideration of risks, benefits and other options for treatment, the patient has consented to  Procedure(s): LEFT HEART CATH AND CORONARY ANGIOGRAPHY (N/A) as a surgical intervention .  The patient's history has been reviewed, patient examined, no change in status, stable for surgery.  I have reviewed the patient's chart and labs.  Questions were answered to the patient's satisfaction.     Lyn Records III

## 2018-12-03 NOTE — Progress Notes (Addendum)
Progress Note  Patient Name: Adam Hebert Date of Encounter: 12/03/2018  Primary Cardiologist: New to Dr. Tresa Endo   Subjective   No chest pain or dyspnea.   Inpatient Medications    Scheduled Meds: . atorvastatin  40 mg Oral q1800  . [START ON 12/04/2018] enoxaparin (LOVENOX) injection  40 mg Subcutaneous Q24H  . metoprolol tartrate  12.5 mg Oral BID  . olopatadine  1 drop Both Eyes BID  . pantoprazole  80 mg Oral Daily  . polyethylene glycol  17 g Oral Daily  . senna  1 tablet Oral BID  . sodium chloride flush  3 mL Intravenous Q12H  . sodium chloride flush  3 mL Intravenous Q12H   Continuous Infusions: . sodium chloride    . sodium chloride    . sodium chloride 1 mL/kg/hr (12/03/18 0458)  . magnesium sulfate 1 - 4 g bolus IVPB 2 g (12/03/18 1011)   PRN Meds: sodium chloride, sodium chloride, acetaminophen, sodium chloride flush, sodium chloride flush   Vital Signs    Vitals:   12/02/18 1745 12/02/18 2052 12/03/18 0405 12/03/18 0701  BP: (!) 157/99 (!) 154/104 (!) 150/90 139/84  Pulse: 66 77 67 62  Resp: 18 18 18 18   Temp: 98.4 F (36.9 C) 98.2 F (36.8 C) 98.4 F (36.9 C)   TempSrc: Oral Oral Oral   SpO2: 100% 100% 98% 98%  Weight:  85 kg    Height:        Intake/Output Summary (Last 24 hours) at 12/03/2018 1024 Last data filed at 12/03/2018 0826 Gross per 24 hour  Intake 1242.83 ml  Output 700 ml  Net 542.83 ml   Filed Weights   12/01/18 2213 12/02/18 0355 12/02/18 2052  Weight: 85 kg 85 kg 85 kg    Telemetry    Unable to review as seen on hallway  ECG    N/A  Physical Exam   GEN: No acute distress.   Neck: No JVD Cardiac: RRR, no murmurs, rubs, or gallops.  Respiratory: Clear to auscultation bilaterally. GI: Soft, nontender, non-distended  MS: No edema; No deformity. Neuro:  Nonfocal  Psych: Normal affect   Labs    Chemistry Recent Labs  Lab 11/30/18 1230 12/01/18 0756 12/02/18 0550 12/03/18 0449  NA 136 136 137 135    K 3.9 3.7 3.7 3.9  CL 101 104 103 100  CO2 23 22 27 25   GLUCOSE 107* 140* 99 116*  BUN 13 13 14 17   CREATININE 0.91 0.91 0.96 1.00  CALCIUM 9.2 9.1 9.0 9.1  PROT 7.8  --   --   --   ALBUMIN 4.1  --   --   --   AST 27  --   --   --   ALT 26  --   --   --   ALKPHOS 78  --   --   --   BILITOT 0.9  --   --   --   GFRNONAA >60 >60 >60 >60  GFRAA >60 >60 >60 >60  ANIONGAP 12 10 7 10      Hematology Recent Labs  Lab 12/01/18 0756 12/02/18 0550 12/03/18 0449  WBC 7.6 8.0 7.8  RBC 4.93 4.89 5.01  HGB 13.6 13.5 14.3  HCT 40.6 40.2 41.3  MCV 82.4 82.2 82.4  MCH 27.6 27.6 28.5  MCHC 33.5 33.6 34.6  RDW 12.8 12.7 12.8  PLT 215 208 230    Cardiac Enzymes Recent Labs  Lab 11/30/18 2003  12/01/18 0044 12/01/18 0756  TROPONINI <0.03 <0.03 <0.03    Radiology    No results found.  Cardiac Studies   Study Conclusions  - Left ventricle: The cavity size was at the upper limits of   normal. Systolic function was mildly to moderately reduced. The   estimated ejection fraction was in the range of 40% to 45%.   Diffuse hypokinesis. Possibly disproportionate hypokinesis of the   inferior myocardium; consistent with ischemia in the distribution   of the right coronary artery. Features are consistent with a   pseudonormal left ventricular filling pattern, with concomitant   abnormal relaxation and increased filling pressure (grade 2   diastolic dysfunction). - Left atrium: The atrium was mildly dilated.  Patient Profile     53 y.o. male from [place] and speaks Arabic with a hx of HTN, HLD admitted for near syncope. Cardiology is asked to see for hypotension and bradycardia. Found to have reduced LVEF.   Assessment & Plan    1. Acute Combined CHF - Echo showed LVEF of 40-45%, diffuse hypokinesis, Possibly disproportionate hypokinesis of the   inferior myocardium; consistent with ischemia in the distribution of the right coronary artery. Grade   2 DD. Euvolemic. Pending cath  today.  - On low dose metoprolol at 12.5mg  BID. Plan to add ACE/ARB post cath.   2. Near syncope - hypotensive initially and brady initially. Unable to review tele as seen in hallway on his way to cath. MD to review.   3. HTN - BP elevated. On low dose BB. Plan to add ACE/ARB post cath.   4. HLD - Continue statin   For questions or updates, please contact [facility] Please consult [email or web address] for contact info under        SignedManson Passey, PA  12/03/2018, 10:24 AM     Patient seen and examined. Agree with assessment and plan.  Patient is back from Cath Lab.  No chest pain.  Sleeping.  Right radial cath site stable without hematoma or ecchymosis.  Cath Lab angios and data reviewed.  No epicardial cardia obstructive disease was demonstrated.  EF at cath was 30 to 35%; significantly more pronounced LV dysfunction then felt on echo when it was interpreted at 40 to 45%.  Will check CRP and sed rate for screening inflammatory markers.  Consider myocarditis as an etiology for reduced EF.  With LV dysfunction, will initiate ARB with probable transition to [facility] if tolerates.   Lennette Bihari, MD, [facility] 12/03/2018 2:45 PM

## 2018-12-03 NOTE — Progress Notes (Signed)
With on-call cardiologist Dr. Cristal Deer about patient's 22 beat run of V. tach overnight.  No mention of this in cardiology note today.  Wanted to make sure that it was brought to cardiology's attention.  She noted that since patient's electrolytes were okay, could be caused by myocarditis which was suggested on patient's heart cath today.  She stated could maximize beta-blocker therapy as able and that overnight if has sustained V. tach, can be treated as needed.  Dr. Cristal Deer also notes that she will reach out to Dr. Tresa Endo to update him and make him aware.  Luis Abed, D.O.  PGY-1 Family Medicine  12/03/2018 5:41 PM

## 2018-12-03 NOTE — Progress Notes (Signed)
Family Medicine Teaching Service Daily Progress Note Intern Pager: [phone]  Patient name: Adam Hebert Medical record number: [id number] Date of birth: [birth year] Age: 53 y.o. Gender: male  Primary Care Provider: Wendee Beavers, DO Consultants: cardiology Code Status: full  Pt Overview and Major Events to Date:  12/30: Admit for intermittent bradycardia, presyncope, and hypotension  Assessment and Plan: Ronelle Attig is a 53 y.o. male presenting with symptomatic bradycardia now found to have HFrEF. PMH is significant for HTN, HLD.  HFrEF  Symptomatic bradycardia  LVH: Acute and intermittent.  Improved.  Initially bradycardic in the high 40s now intermittently between 50 and 70s. EKG NSR at 74 bpm without ST changes or T wave abnormalities, LVH, QTc 426 without heart block during admission.  Troponins negative x3.  Unlikely ACS.  TTE significant for EF 40-45% with diffuse hypokinesis, G2DD, and a mildly dilated LA. Did have a 21 beat run of V. tach overnight. - Cardiology consulted, appreciate recommendations tentative heart cath on 12/03/2018 - Telemetry, will likely need Holter monitor at discharge - Maintain potassium >4 and magnesium >2 - Continue Lopressor 12.5 mg twice daily, will switch to either carvedilol or Toprol-XL at discharge - Plan to start ACE/ARB therapy at discharge  Presyncope: Acute and intermittent likely related to bradycardia as mentioned above.  No LOC or falls.  Neuro exam grossly intact.  Orthostatics negative. - PT/OT consulted, appreciate recommendations - See plan for symptomatic bradycardia  Hypotension: Acute and seems to be related to symptomatic bradycardia.  Has maintained normotensive BP during admission. - See plan for symptomatic bradycardia  Nonproductive cough: Subacute.  Improved.  No signs of pneumonia or COPD exacerbation. - Continue Prilosec 80 mg daily  Hyperlipidemia: Chronic.  Stable.  Secondary prevention. -  Continue Lipitor 40 mg daily  Right shoulder pain: Subacute.  Consistent with MSK nature.  No signs of ACS based on work-up.  Occupation with [employer] likely contributing to pain.  Suspect this may be rotator cuff related. - Follow-up outpatient  FEN/GI: N.p.o. PPx: Lovenox, SCDs for planned cath  Disposition: Pending heart cath for HFrEF based on recent echo.  Anticipate discharge home pending cardiology recommendations.  Will likely need Holter monitor setup at discharge.  Subjective:  Patient feeling well this morning.  Denies chest pain, shortness of breath or palpitations.  Has not had any episodes of presyncope overnight.  Ready for his heart catheterization.  Objective: Temp:  [98.2 F (36.8 C)-98.4 F (36.9 C)] 98.4 F (36.9 C) (01/02 0405) Pulse Rate:  [62-77] 62 (01/02 0701) Resp:  [18] 18 (01/02 0701) BP: (139-157)/(84-104) 139/84 (01/02 0701) SpO2:  [98 %-100 %] 98 % (01/02 0701) Weight:  [85 kg] 85 kg (01/01 2052) Physical Exam: General: well nourished, well developed, NAD with non-toxic appearance HEENT: normocephalic, atraumatic, moist mucous membranes Neck: supple, non-tender without lymphadenopathy Cardiovascular: regular rate and rhythm without murmurs, rubs, or gallops Lungs: clear to auscultation bilaterally with normal work of breathing Abdomen: soft, non-tender, non-distended, normoactive bowel sounds Skin: warm, dry, no rashes or lesions, cap refill < 2 seconds Extremities: warm and well perfused, normal tone, no edema   Laboratory: Recent Labs  Lab 12/01/18 0756 12/02/18 0550 12/03/18 0449  WBC 7.6 8.0 7.8  HGB 13.6 13.5 14.3  HCT 40.6 40.2 41.3  PLT 215 208 230   Recent Labs  Lab 11/30/18 1230 12/01/18 0756 12/02/18 0550 12/03/18 0449  NA 136 136 137 135  K 3.9 3.7 3.7 3.9  CL 101 104 103  100  CO2 23 22 27 25   BUN 13 13 14 17   CREATININE 0.91 0.91 0.96 1.00  CALCIUM 9.2 9.1 9.0 9.1  PROT 7.8  --   --   --   BILITOT 0.9  --   --    --   ALKPHOS 78  --   --   --   ALT 26  --   --   --   AST 27  --   --   --   GLUCOSE 107* 140* 99 116*   Lipid panel: Cholesterol 222, TGY 251, HDL 34, LDL 138 Troponin I: Negative x3 Influenza panel: Negative HIV antibody: Nonreactive Hemoglobin A1c: 5.4 Phosphorus: 4.2 (WNL) Magnesium: 2.0 (WNL) Lipase: 16 TSH: 2.111 (WNL) I-STAT lactic acid: 1.42 Urinalysis: Unremarkable Urine culture: No growth  Imaging/Diagnostic Tests: CHEST - 2 VIEW (11/30/2018) IMPRESSION: Mild cardiomegaly.  No active disease.  ECHOCARDIOGRAM COMPLETE (12/01/2018) Study Conclusions Left ventricle: The cavity size was at the upper limits of normal. Systolic function was mildly to moderately reduced. The estimated ejection fraction was in the range of 40% to 45%. Diffuse hypokinesis. Possibly disproportionate hypokinesis of the inferior myocardium; consistent with ischemia in the distribution of the right coronary artery. Features are consistent with a pseudonormal left ventricular filling pattern, with concomitant abnormal relaxation and increased filling pressure (grade 2 diastolic dysfunction). Left atrium was mildly dilated.    Wendee Beavers, DO 12/03/2018, 7:55 AM PGY-3, [place] [facility] FPTS Intern pager: [phone], text pages welcome

## 2018-12-04 DIAGNOSIS — I472 Ventricular tachycardia: Secondary | ICD-10-CM

## 2018-12-04 DIAGNOSIS — I4729 Other ventricular tachycardia: Secondary | ICD-10-CM

## 2018-12-04 DIAGNOSIS — I428 Other cardiomyopathies: Secondary | ICD-10-CM

## 2018-12-04 LAB — CBC
HEMATOCRIT: 40.8 % (ref 39.0–52.0)
Hemoglobin: 13.9 g/dL (ref 13.0–17.0)
MCH: 28.2 pg (ref 26.0–34.0)
MCHC: 34.1 g/dL (ref 30.0–36.0)
MCV: 82.8 fL (ref 80.0–100.0)
Platelets: 217 10*3/uL (ref 150–400)
RBC: 4.93 MIL/uL (ref 4.22–5.81)
RDW: 12.9 % (ref 11.5–15.5)
WBC: 8.1 10*3/uL (ref 4.0–10.5)
nRBC: 0 % (ref 0.0–0.2)

## 2018-12-04 LAB — BASIC METABOLIC PANEL
ANION GAP: 8 (ref 5–15)
BUN: 15 mg/dL (ref 6–20)
CHLORIDE: 103 mmol/L (ref 98–111)
CO2: 25 mmol/L (ref 22–32)
Calcium: 9.1 mg/dL (ref 8.9–10.3)
Creatinine, Ser: 1.1 mg/dL (ref 0.61–1.24)
GFR calc Af Amer: >60 mL/min (ref >60)
GFR calc non Af Amer: >60 mL/min (ref >60)
Glucose, Bld: 125 mg/dL — ABNORMAL HIGH (ref 70–99)
Potassium: 4.2 mmol/L (ref 3.5–5.1)
Sodium: 136 mmol/L (ref 135–145)

## 2018-12-04 LAB — TROPONIN I
Troponin I: <0.03 ng/mL (ref <0.03)
Troponin I: <0.03 ng/mL (ref <0.03)
Troponin I: <0.03 ng/mL (ref <0.03)

## 2018-12-04 LAB — SEDIMENTATION RATE: Sed Rate: 14 mm/hr (ref 0–16)

## 2018-12-04 LAB — C-REACTIVE PROTEIN: CRP: <0.8 mg/dL (ref <1.0)

## 2018-12-04 MED ORDER — METOPROLOL TARTRATE 25 MG PO TABS
25.0000 mg | ORAL_TABLET | Freq: Three times a day (TID) | ORAL | Status: DC
  Administered 2018-12-04 – 2018-12-05 (×3): 25 mg via ORAL
  Filled 2018-12-04 (×3): qty 1

## 2018-12-04 MED ORDER — METOPROLOL TARTRATE 25 MG PO TABS
25.0000 mg | ORAL_TABLET | Freq: Two times a day (BID) | ORAL | Status: DC
  Administered 2018-12-04: 11:00:00 25 mg via ORAL
  Filled 2018-12-04: qty 1

## 2018-12-04 MED ORDER — ALUM & MAG HYDROXIDE-SIMETH 200-200-20 MG/5ML PO SUSP
30.0000 mL | Freq: Once | ORAL | Status: AC
  Administered 2018-12-04: 11:00:00 30 mL via ORAL
  Filled 2018-12-04: qty 30

## 2018-12-04 NOTE — Progress Notes (Signed)
Patient had 36 beats of Vtach while on bed, described  "Feeling  fast beat on chest earlier", denies pain or SOB. B/P 154/91. Stat EKG  done , Melvyn Novas PA notified , current med discussed with latter PA, will see and assess patient, continue to monitor .

## 2018-12-04 NOTE — Progress Notes (Addendum)
Family Medicine Teaching Service Daily Progress Note Intern Pager: [phone]  Patient name: Adam Hebert Medical record number: [id number] Date of birth: [birth year] Age: 53 y.o. Gender: male  Primary Care Provider: Wendee Beavers, DO Consultants: cardiology Code Status: full  Pt Overview and Major Events to Date:  12/30: Admit for intermittent bradycardia, presyncope, and hypotension  Assessment and Plan: Adam Hebert is a 53 y.o. male presenting with symptomatic bradycardia now found to have HFrEF. PMH is significant for HTN, HLD.  HFrEF  Symptomatic bradycardia  LVH:  Patient had 36 beats of non-sustained VT on telemetry. Patient stated he was having left-sided chest pain on exam. He states it feels like his acid reflux. Ordered an EKF and troponins. Initial troponins negative. Had cath yesterday without interventions.  - Cardiology consulted, appreciate recommendations tentative heart cath on 12/03/2018 - Telemetry, will likely need Holter monitor at discharge - Maintain potassium >4 and magnesium >2 - Continue Lopressor 12.5 mg qd - transition from losartan to entresto per cards - f/u trending trops  Presyncope: Acute and intermittent likely related to bradycardia as mentioned above.  No LOC or falls.  Neuro exam grossly intact.  Orthostatics negative. - PT/OT consulted, appreciate recommendations - See plan for symptomatic bradycardia  Hypotension: normotensive with one reading of hypertension to 154/91 this morning.  - See plan for symptomatic bradycardia  Nonproductive cough: Subacute.  Improved.  No signs of pneumonia or COPD exacerbation. - Continue Prilosec 80 mg daily  Hyperlipidemia: Chronic.  Stable.  Secondary prevention. - Continue Lipitor 40 mg daily  Right shoulder pain: Subacute.  Consistent with MSK nature.  No signs of ACS based on work-up.  Occupation with [employer] likely contributing to pain.  Suspect this may be rotator cuff  related. Did not complain of right shoulder pain this morning.  - Follow-up outpatient  FEN/GI: heart healthy diet PPx: Lovenox, SCDs for planned cath  Disposition: discharge will be pending cardiology clearance as stable  Subjective:  Patient complains of left sided chest pain for 15 minutes. States he has this similar pain with reflux. He did just eat breakfast prior to interview. He denies any palpitations, nausea, diaphoresis.   Objective: Temp:  [98 F (36.7 C)-98.3 F (36.8 C)] 98.3 F (36.8 C) (01/03 3419) Pulse Rate:  [57-70] 70 (01/03 0633) Resp:  [13-16] 13 (01/03 0633) BP: (115-154)/(72-91) 154/91 (01/03 0838) SpO2:  [98 %-100 %] 98 % (01/03 6222) Weight:  [83 kg] 83 kg (01/03 9798)   Physical Exam: General: well nourished, well developed, NAD with non-toxic appearance. Difficult interview with language barrier.  HEENT: normocephalic, atraumatic, moist mucous membranes Neck: supple, non-tender without lymphadenopathy Cardiovascular: regular rate and rhythm without murmurs, rubs, or gallops Lungs: clear to auscultation bilaterally with normal work of breathing Abdomen: soft, non-tender, non-distended, normoactive bowel sounds Skin: warm, dry, no rashes or lesions, cap refill < 2 seconds Extremities: warm and well perfused, normal tone, no edema   Laboratory: Recent Labs  Lab 12/02/18 0550 12/03/18 0449 12/04/18 0126  WBC 8.0 7.8 8.1  HGB 13.5 14.3 13.9  HCT 40.2 41.3 40.8  PLT 208 230 217   Recent Labs  Lab 11/30/18 1230  12/02/18 0550 12/03/18 0449 12/04/18 0126  NA 136   < > 137 135 136  K 3.9   < > 3.7 3.9 4.2  CL 101   < > 103 100 103  CO2 23   < > 27 25 25   BUN 13   < > 14  17 15  CREATININE 0.91   < > 0.96 1.00 1.10  CALCIUM 9.2   < > 9.0 9.1 9.1  PROT 7.8  --   --   --   --   BILITOT 0.9  --   --   --   --   ALKPHOS 78  --   --   --   --   ALT 26  --   --   --   --   AST 27  --   --   --   --   GLUCOSE 107*   < > 99 116* 125*   < > =  values in this interval not displayed.   Lipid panel: Cholesterol 222, TGY 251, HDL 34, LDL 138 Troponin I: Negative x3 Influenza panel: Negative HIV antibody: Nonreactive Hemoglobin A1c: 5.4 Phosphorus: 4.2 (WNL) Magnesium: 2.0 (WNL) Lipase: 16 TSH: 2.111 (WNL) I-STAT lactic acid: 1.42 Urinalysis: Unremarkable Urine culture: No growth  Imaging/Diagnostic Tests: CHEST - 2 VIEW (11/30/2018) IMPRESSION: Mild cardiomegaly.  No active disease.  ECHOCARDIOGRAM COMPLETE (12/01/2018) Study Conclusions Left ventricle: The cavity size was at the upper limits of normal. Systolic function was mildly to moderately reduced. The estimated ejection fraction was in the range of 40% to 45%. Diffuse hypokinesis. Possibly disproportionate hypokinesis of the inferior myocardium; consistent with ischemia in the distribution of the right coronary artery. Features are consistent with a pseudonormal left ventricular filling pattern, with concomitant abnormal relaxation and increased filling pressure (grade 2 diastolic dysfunction). Left atrium was mildly dilated.    Leeroy Bock, DO 12/04/2018, 12:48 PM PGY-1, [facility] FPTS Intern pager: [phone], text pages welcome

## 2018-12-04 NOTE — Progress Notes (Addendum)
Family Medicine Teaching Service Daily Progress Note Intern Pager: [phone]  Patient name: Adam Hebert Medical record number: [id number] Date of birth: 1966-10-15 Age: 53 y.o. Gender: male  Primary Care Provider: Sasser Bing, DO Consultants: cardiology Code Status: full  Pt Overview and Major Events to Date:  12/30: Admit for intermittent bradycardia, presyncope, and hypotension 12/31: ECHO - LF EF 40-45%, diffuse hypokinesis 1/2: Cardiac Catheterization: normal coronary arteries, global LV systolic dysfunction with EF 30-35%    Assessment and Plan: Adam Hebert is a 53 y.o. male presenting with symptomatic bradycardia now found to have HFrEF. PMH is significant for HTN, HLD.  Non-Sustained VT:  Episodes of NSVT on 1/2 and 1/3. Maintained NSR overnight with no recurrent episodes of VT. EKG NSR and troponins negative x 3. Cardiology following. Increased Metoprolol to 69m q8hrs yesterday without any side effects. - Will continue to monitor.   HFrEF  Symptomatic bradycardia  LVH:  HR's in the 60-70's. Denies any chest pain or palpitations today. EKG NSR, troponins negative x 3, CRP and ESR WNL. These are reassuring for myocarditis. Cardiac MRI to further evaluate for myocarditis unable to be completed yesterday, scheduled for 1/6. Cardiology continuing to follow. Metoprolol increased as above. AM K+ 3.9 and Mag 2.1. Will replace K+ this morning and continue to monitor.  - Cardiology consulted, appreciate recommendations - Telemetry, will likely need Holter monitor at discharge - Maintain potassium >4 and magnesium >2 - 40 mEq K-dur this AM - Continue Losartan 271mQD  - Per cards, plan to transition to [facility] to discharge if BP tolerates - Continue Metoprolol 2530m8hrs - AM Mag and BMP  Presyncope: Acute and intermittent likely related to bradycardia as mentioned above.  No LOC or falls.  Denies lightheadedness or dizziness today. Neuro exam has  remained grossly intact. - PT/OT consulted, appreciate recommendations - See plan for symptomatic bradycardia  Hypotension: normotensive overnight.   - See plan for symptomatic bradycardia  Nonproductive cough: Subacute.  Improved. Denies any cough. No signs of pneumonia or COPD exacerbation. - Continue Prilosec 80 mg daily  Hyperlipidemia: Chronic.  Stable.  Secondary prevention. - Continue Lipitor 40 mg daily  FEN/GI: heart healthy diet PPx: Lovenox, SCDs for planned cath  Disposition: discharge will be pending cardiology clearance as stable  Subjective:  Patient denies any chest pain, palpitations, light headedness, dizziness, cough, or shoulder pain this morning.  He denies any palpitations, nausea, diaphoresis.  He notes he is feeling well and has no complaints. He is eager to go home.   Objective: Temp:  [98 F (36.7 C)-98.1 F (36.7 C)] 98.1 F (36.7 C) (01/04 0504) Pulse Rate:  [59-64] 64 (01/04 0646) Resp:  [13-18] 14 (01/04 0504) BP: (97-159)/(59-92) 159/92 (01/04 0646) SpO2:  [96 %-98 %] 98 % (01/04 0504) Weight:  [83.1 kg] 83.1 kg (01/04 0504)   Physical Exam: General: well nourished, well developed, in no acute distress with non-toxic appearance, lying comfortably in bed HEENT: normocephalic, atraumatic, moist mucous membranes Neck: supple, normal ROM CV: regular rate and rhythm without murmurs, rubs, or gallops, no lower extremity edema, 2+ radial and pedal pulses b/l Lungs: clear to auscultation bilaterally with normal work of breathing Abdomen: soft, non-tender, non-distended, normoactive bowel sounds Skin: warm, dry, no rashes or lesions Extremities: warm and well perfused, normal tone Neuro: Alert and oriented, speech normal although language barrier  Laboratory: Recent Labs  Lab 12/02/18 0550 12/03/18 0449 12/04/18 0126  WBC 8.0 7.8 8.1  HGB 13.5 14.3  13.9  HCT 40.2 41.3 40.8  PLT 208 230 217   Recent Labs  Lab 11/30/18 1230   12/03/18 0449 12/04/18 0126 12/05/18 0500  NA 136   < > 135 136 136  K 3.9   < > 3.9 4.2 3.9  CL 101   < > 100 103 104  CO2 23   < > _0 BUN 13   < > _1 CREATININE 0.91   < > 1.00 1.10 0.89  CALCIUM 9.2   < > 9.1 9.1 8.9  PROT 7.8  --   --   --   --   BILITOT 0.9  --   --   --   --   ALKPHOS 78  --   --   --   --   ALT 26  --   --   --   --   AST 27  --   --   --   --   GLUCOSE 107*   < > 116* 125* 100*   < > = values in this interval not displayed.   ESR: 14 (WNL) CRP: <0.8 (WNL) PT/INR: 13.6/1.05 (WNL) Repeat Troponin I (1/3): Negative x 3 Lipid panel: Cholesterol 222, TGY 251, HDL 34, LDL 138 Troponin I: Negative x3 Influenza panel: Negative HIV antibody: Nonreactive Hemoglobin A1c: 5.4 Phosphorus: 4.2 (WNL) Magnesium: 2.0>1.9 (WNL) Lipase: 16 TSH: 2.111 (WNL) I-STAT lactic acid: 1.42 Urinalysis: Unremarkable Urine culture: No growth  Imaging/Diagnostic Tests: CHEST - 2 VIEW (11/30/2018) IMPRESSION: Mild cardiomegaly.  No active disease.  ECHOCARDIOGRAM COMPLETE (12/01/2018) Study Conclusions Left ventricle: The cavity size was at the upper limits of normal. Systolic function was mildly to moderately reduced. The estimated ejection fraction was in the range of 40% to 45%. Diffuse hypokinesis. Possibly disproportionate hypokinesis of the inferior myocardium; consistent with ischemia in the distribution of the right coronary artery. Features are consistent with a pseudonormal left ventricular filling pattern, with concomitant abnormal relaxation and increased filling pressure (grade 2 diastolic dysfunction). Left atrium was mildly dilated.   Danna Hefty, DO 12/05/2018, 7:44 AM PGY-1, [place] Intern pager: [phone], text pages welcome

## 2018-12-04 NOTE — Progress Notes (Signed)
Received call from MRI dept, cardiac MRI  unable to do today,early available schedule is on Monday next week. K.Goodrich PA notified.

## 2018-12-04 NOTE — Progress Notes (Addendum)
Progress Note  Patient Name: Adam Hebert Date of Encounter: 12/04/2018  Primary Cardiologist: No primary care provider on file.   Subjective   Patient denies any chest pain or shortness of breath. He does reports occasional palpitations with associated lightheadedness and dizziness. He denies any presyncope/syncope.  Patient has 37 beats of non-sustained VT this morning around 8:30am. Per RN, patient noted palpitations at that time.  Inpatient Medications    Scheduled Meds: . alum & mag hydroxide-simeth  30 mL Oral Once  . atorvastatin  40 mg Oral q1800  . enoxaparin (LOVENOX) injection  40 mg Subcutaneous Q24H  . losartan  25 mg Oral Daily  . metoprolol tartrate  12.5 mg Oral BID  . olopatadine  1 drop Both Eyes BID  . pantoprazole  80 mg Oral Daily  . polyethylene glycol  17 g Oral Daily  . senna  1 tablet Oral BID  . sodium chloride flush  3 mL Intravenous Q12H  . sodium chloride flush  3 mL Intravenous Q12H   Continuous Infusions: . sodium chloride    . sodium chloride     PRN Meds: sodium chloride, sodium chloride, acetaminophen, ondansetron (ZOFRAN) IV, sodium chloride flush, sodium chloride flush   Vital Signs    Vitals:   12/03/18 1715 12/03/18 1926 12/03/18 2000 12/04/18 0633  BP: 115/78 122/82  128/80  Pulse: 65 69  70  Resp: 13 16 16 13   Temp:  98 F (36.7 C)  98.3 F (36.8 C)  TempSrc:  Oral  Oral  SpO2: 100% 100%  98%  Weight:    83 kg  Height:        Intake/Output Summary (Last 24 hours) at 12/04/2018 0932 Last data filed at 12/04/2018 [id number] Gross per 24 hour  Intake 962.5 ml  Output 750 ml  Net 212.5 ml   Filed Weights   12/02/18 0355 12/02/18 2052 12/04/18 0633  Weight: 85 kg 85 kg 83 kg    Telemetry    Sinus rhythm with heart rates in the high 50's to 70's. Run of non-sustained VT of 37 beats this morning. - Personally Reviewed  ECG    Normal sinus rhythm with no significant changes. - Personally Reviewed  Physical Exam     GEN: Alert and in no acute distress.   Neck: Supple.  Cardiac: RRR. No murmurs, rubs, or gallops.  Respiratory: No increased work of breathing. Clear to auscultation bilaterally. No wheezes, rhonchi, or rales. GI: Abdomen soft, non-distended, and non-tender. Bowel sounds present.  MS: No lower extremity edema. No deformities. Neuro:  No focal deficits. Psych: Normal affect.  Labs    Chemistry Recent Labs  Lab 11/30/18 1230  12/02/18 0550 12/03/18 0449 12/04/18 0126  NA 136   < > 137 135 136  K 3.9   < > 3.7 3.9 4.2  CL 101   < > 103 100 103  CO2 23   < > 27 25 25   GLUCOSE 107*   < > 99 116* 125*  BUN 13   < > 14 17 15   CREATININE 0.91   < > 0.96 1.00 1.10  CALCIUM 9.2   < > 9.0 9.1 9.1  PROT 7.8  --   --   --   --   ALBUMIN 4.1  --   --   --   --   AST 27  --   --   --   --   ALT 26  --   --   --   --  ALKPHOS 78  --   --   --   --   BILITOT 0.9  --   --   --   --   GFRNONAA >60   < > >60 >60 >60  GFRAA >60   < > >60 >60 >60  ANIONGAP 12   < > 7 10 8    < > = values in this interval not displayed.     Hematology Recent Labs  Lab 12/02/18 0550 12/03/18 0449 12/04/18 0126  WBC 8.0 7.8 8.1  RBC 4.89 5.01 4.93  HGB 13.5 14.3 13.9  HCT 40.2 41.3 40.8  MCV 82.2 82.4 82.8  MCH 27.6 28.5 28.2  MCHC 33.6 34.6 34.1  RDW 12.7 12.8 12.9  PLT 208 230 217    Cardiac Enzymes Recent Labs  Lab 11/30/18 2003 12/01/18 0044 12/01/18 0756  TROPONINI <0.03 <0.03 <0.03   No results for input(s): TROPIPOC in the last 168 hours.   BNPNo results for input(s): BNP, PROBNP in the last 168 hours.   DDimer No results for input(s): DDIMER in the last 168 hours.   Radiology    No results found.  Cardiac Studies   Echocardiogram 12/01/2018: Study Conclusions: - Left ventricle: The cavity size was at the upper limits of   normal. Systolic function was mildly to moderately reduced. The   estimated ejection fraction was in the range of 40% to 45%.   Diffuse hypokinesis.  Possibly disproportionate hypokinesis of the   inferior myocardium; consistent with ischemia in the distribution   of the right coronary artery. Features are consistent with a   pseudonormal left ventricular filling pattern, with concomitant   abnormal relaxation and increased filling pressure (grade 2   diastolic dysfunction). - Left atrium: The atrium was mildly dilated. _______________  Left Heart Catheterization 12/03/2018:  Right coronary dominance.  Angiographically normal coronary arteries.  Global left ventricular systolic dysfunction with EF estimated in the 30 to 35% range.  LVEDP is normal.  Findings are consistent with chronic systolic heart failure versus acute systolic heart failure with compensated hemodynamics.  Recommendations:  Guideline directed therapy for left ventricular systolic dysfunction: ARNI, beta-blocker, and MRA as tolerated by blood pressure.  Consider myocarditis as etiology for reduced EF.  Check screening inflammatory markers (CRP/sed rate) and rule out sarcoid.   Patient Profile    Mr. Adam Hebert is a 53 y.o. male from [place] who speaks Arabic with a history of hypertension and hyperlipidemia who presented to [facility] on [facility]/30/2018 for evaluation of near syncope. Cardiology was consulted for evaluation of hypotension and bradycardia.  Assessment & Plan    Acute Combined CHF - Echo showed LVEF 40-45%, diffuse hypokinesis, possibly disproportionate hypokinesis of the inferior myocardium consistent with ischemia in the distribution of the RCA. Grade 2 diastolic dysfunction also noted.  - Left heart catheterization showed normal coronary arteries with global LV systolic dysfunction. LVEDP normal.  - Patient does not appear volume overloaded on exam. - Sed rate and CRP were both normal. - Continue Losartan 25mg  daily. - Will increase Lopressor from 12.5 twice daily to 25mg  three times daily due to runs of non-sustained VT of 37 beats.  - Discussed with  MD. Will get cardiac MRI to rule out myocarditis.   Non-Sustained VT - Per chart review, patient had about 22 beats of non-sustained VT overnight on 12/02/2018. Patient also had 37 beats of non-sustained VT this morning around 8:30am. - EKG this morning around 8:45am showed normal sinus rhythm with no significant  changes from prior tracings. - Will increase Lopressor as above.   Near Syncope - Hypotensive and bradycardic initially.  - Patient did have some lightheadedness/dizziness with the palpitations this morning. See above.  Hypertension - Most recent BP 128/80. - Continue Losartan and Lopressor.   Hyperlipidemia - Lipid panel this admission: Cholesterol 222, Triglycerides 251, HDL 34, LDL 138.  - Continue high-intensity statin.    For questions or updates, please contact [facility] Please consult [email or web address] for contact info under        Signed, Corrin Parker, PA-C  12/04/2018, 9:32 AM     Patient seen and examined. Agree with assessment and plan.  Patient is resting comfortably.  Catheterization reveals a nonischemic cardiomyopathy.  Certain was raised for possible myocarditis.  High-sensitivity CRP is normal at less than 0.8.  Sedimentation rate is normal making this less likely.  However will obtain MRI.  Patient has had several episodes of nonsustained VT approximately 380-400 ms, with this episodes of 22 and 37 beats recorded.  Low-dose beta-blocker therapy with metoprolol 12.5 mg twice a day was implemented yesterday.  Will titrate to metoprolol 25 mg every 8 hours today.  However he may require amiodarone. If recurrent NSVT consider EP evaluation.  He was started on low-dose losartan 25 mg.  May be able to transition to [facility] prior to discharge if blood pressure allows.   Lennette Bihari, MD, [facility] 12/04/2018 12:16 PM

## 2018-12-05 ENCOUNTER — Other Ambulatory Visit: Payer: Self-pay | Admitting: Cardiology

## 2018-12-05 DIAGNOSIS — I517 Cardiomegaly: Secondary | ICD-10-CM

## 2018-12-05 LAB — BASIC METABOLIC PANEL
Anion gap: 7 (ref 5–15)
BUN: 18 mg/dL (ref 6–20)
CO2: 25 mmol/L (ref 22–32)
Calcium: 8.9 mg/dL (ref 8.9–10.3)
Chloride: 104 mmol/L (ref 98–111)
Creatinine, Ser: 0.89 mg/dL (ref 0.61–1.24)
GFR calc Af Amer: >60 mL/min (ref >60)
GFR calc non Af Amer: >60 mL/min (ref >60)
Glucose, Bld: 100 mg/dL — ABNORMAL HIGH (ref 70–99)
Potassium: 3.9 mmol/L (ref 3.5–5.1)
Sodium: 136 mmol/L (ref 135–145)

## 2018-12-05 LAB — MAGNESIUM: MAGNESIUM: 2.1 mg/dL (ref 1.7–2.4)

## 2018-12-05 MED ORDER — METOPROLOL TARTRATE 25 MG PO TABS: 37.5000 mg | ORAL_TABLET | Freq: Two times a day (BID) | ORAL | Status: DC

## 2018-12-05 MED ORDER — METOPROLOL SUCCINATE ER 100 MG PO TB24: 100.0000 mg | ORAL_TABLET | Freq: Every day | ORAL | 0 refills | Status: DC

## 2018-12-05 MED ORDER — ATORVASTATIN CALCIUM 40 MG PO TABS: 40.0000 mg | ORAL_TABLET | Freq: Every day | ORAL | 0 refills | Status: DC

## 2018-12-05 MED ORDER — SACUBITRIL-VALSARTAN 24-26 MG PO TABS: 1.0000 | ORAL_TABLET | Freq: Two times a day (BID) | ORAL | 0 refills | Status: DC

## 2018-12-05 MED ORDER — METOPROLOL SUCCINATE ER 50 MG PO TB24
100.0000 mg | ORAL_TABLET | Freq: Every day | ORAL | Status: DC
  Administered 2018-12-05: 10:00:00 100 mg via ORAL
  Filled 2018-12-05: qty 2

## 2018-12-05 MED ORDER — POTASSIUM CHLORIDE CRYS ER 20 MEQ PO TBCR
40.0000 meq | EXTENDED_RELEASE_TABLET | Freq: Once | ORAL | Status: AC
  Administered 2018-12-05: 09:00:00 40 meq via ORAL
  Filled 2018-12-05: qty 2

## 2018-12-05 MED ORDER — SACUBITRIL-VALSARTAN 24-26 MG PO TABS
1.0000 | ORAL_TABLET | Freq: Two times a day (BID) | ORAL | Status: DC
  Administered 2018-12-05: 1 via ORAL
  Filled 2018-12-05: qty 1

## 2018-12-05 NOTE — Discharge Instructions (Signed)
Cardiomyopathy, Adult         Cardiomyopathy is a long-term (chronic) disease of the heart muscle. The disease makes the heart muscle thick, weak, or stiff. As a result, the heart works harder to pump blood. Over time cardiomyopathy can lead to an irregular heartbeat (arrhythmia) and heart failure.  There are several types of cardiomyopathy:   Dilated cardiomyopathy. This type causes the ventricles to become weak and stretched out.   Hypertrophic cardiomyopathy. This type causes the heart muscle to thicken.   Restrictive cardiomyopathy. This type causes the heart muscle to become stiff.   Ischemic cardiomyopathy. This type involves narrowing arteries that cause the walls of the heart to get thinner.   Peripartum cardiomyopathy. This type occurs during pregnancy or shortly after pregnancy.  What are the causes?  This condition may be caused by:   A gene that is passed down (inherited) from a family member.   A medical condition that damages the heart, such as:  ? Diabetes.  ? High blood pressure.  ? Viral infection of the heart.  ? Heart attack.  ? Coronary heart disease.   Alcoholism.   Using illegal drugs or some prescription medicines.   Pregnancy.   Your body absorbing and storing too much iron (hemochromatosis).   Autoimmune diseases, connective tissue diseases, endocrine diseases, and muscle diseases.   Cancer treatments.   Buildup of proteins in your organs (amyloidosis), or inflammation in your organs (sarcoidosis).  Often, the cause is not known.  What increases the risk?  This condition is more likely to develop in people who:   Have a family history of cardiomyopathy or other heart problems.   Are overweight or obese.   Use illegal drugs.   Abuse alcohol.   Have a medical condition that damages the heart.  What are the signs or symptoms?  Symptoms of this condition include:   Shortness of breath, especially during activity.   Fatigue.   An irregular heartbeat and heart  murmurs.   Dizziness.   Light-headedness.   Fainting.   Chest pain.   Coughing.   Swelling in the lower legs, ankles, feet, abdomen, and neck veins.  Often, people with this condition have no symptoms.  How is this diagnosed?  This condition is diagnosed based on:   Your symptoms and medical history.   A physical exam.   Tests.  Tests may include:   Blood tests.   Imaging studies of your heart, such as:  ? X-rays.  ? An echocardiogram.  ? An MRI.   An electrocardiogram (ECG). This records your heart's electrical activity.   A test in which you wear a portable device (event monitor) to record your heart's electrical activity while you go about your day.   A stress test. This monitors your heart's activity while exercising.   Cardiac catheterization. This procedure checks the blood pressure and blood flow in your heart.   An angiogram. This is an injection of dye into your arteries before imaging studies are taken.   Heart tissue biopsy. This removes a sample of heart tissue for examination.  How is this treated?  Treatment for this condition depends on the type of cardiomyopathy you have and the severity of your symptoms. If you do not have symptoms, you may not need treatment. If you need treatment, it may include:   Lifestyle changes, such as:  ? Eating a heart-healthy diet that includes plenty of fruits, vegetables, and whole grains, and cutting down on salt (sodium).  ?   Maintaining a healthy weight, and losing weight, if needed.  ? Getting regular exercise.  ? Quitting smoking, if you smoke.  ? Avoiding alcohol.  ? Medicine to:   Lower your blood pressure.   Slow down your heart rate.   Keep your heart beating in a steady rhythm.   Clear excess fluids from your body.   Prevent blood clots.   Balance minerals (electrolytes) in your body and get rid of extra sodium in your body.   Reduce inflammation.   Strengthen your heartbeat.  ? Surgery to:   Repair a defect.   Remove thickened  tissue.   Destroy tissues in the area of abnormal electrical activity (ablation).   Implant a device to treat serious heart rhythm problems (implantable cardioverter-defibrillator, or ICD), or a pacemaker.   Replace your heart (heart transplant) if all other treatments have failed (end stage).  Other treatments may include cardiac resynchronization therapy (CRT) or a left ventricular assist device (LVAD).  Follow these instructions at home:  Lifestyle     Eat a heart-healthy diet. Work with your health care provider or a registered dietitian to learn about healthy eating options.   Maintain a healthy weight.   Stay physically active. Ask your health care provider to suggest some activities that are good for you.   Do not use any products that contain nicotine or tobacco, such as cigarettes and e-cigarettes. If you need help quitting, ask your health care provider.   Limit alcohol intake to no more than one drink per day for nonpregnant women and no more than two drinks per day for men. One drink equals 12 oz of beer, 5 oz of wine, or 1 oz of hard liquor.   Try to get at least 7 hours of sleep each night.   Find healthy ways to manage stress.  General instructions   Take over-the-counter and prescription medicines only as told by your health care provider. Some medicines can be dangerous for your heart.   Tell all health care providers, including your dentist, that you have cardiomyopathy. When you visit the dentist or have surgery, ask your health care provider if you need antibiotics before having dental care or before the surgery.   Ask your health care provider if you should wear a medical identification bracelet. This may be important if you have a pacemaker or a defibrillator.   Make sure you get all recommended vaccinations and an annual flu shot.   Work closely with your health care provider to manage any long-lasting (chronic) conditions.   Keep all follow-up visits as told by your health  care provider. This is important, even if you do not have any symptoms. Your health care provider may need to make sure your condition is not getting worse.  How is this prevented?  This condition cannot be prevented. Parents, siblings, and children of people with this condition may be at risk for the condition. It is a good idea for them to get screened for the condition because it is best when cardiomyopathy is found early. Screening is done with an ECG and echocardiogram.  People who want to start a family may also want to meet with a genetic counselor to discuss the risk of having a child with cardiomyopathy.  Contact a health care provider if:   Your symptoms get worse.   You have new symptoms.  Get help right away if:   You have severe chest pain.   You have shortness of breath.     You cough up pink, bubbly material.   You have sudden sweating.   You feel nauseous and you vomit.   You suddenly become light-headed or dizzy.   You feel your heart beating very quickly.   It feels like your heart is skipping beats.  These symptoms may represent a serious problem that is an emergency. Do not wait to see if the symptoms will go away. Get medical help right away. Call your local emergency services (911 in the [place] Do not drive yourself to the hospital.  Summary   Cardiomyopathy is a long-term (chronic) disease of the heart muscle.   Over time cardiomyopathy can lead to an irregular heartbeat (arrhythmia) and heart failure.  This information is not intended to replace advice given to you by your health care provider. Make sure you discuss any questions you have with your health care provider.  Document Released: 01/31/2005 Document Revised: 02/07/2017 Document Reviewed: 05/20/2016  Elsevier Interactive Patient Education  2019 [employer]

## 2018-12-05 NOTE — Progress Notes (Addendum)
Progress Note  Patient Name: Adam Hebert Date of Encounter: 12/05/2018  Primary Cardiologist: Dr Tresa Endo  Subjective   No SOB  Inpatient Medications    Scheduled Meds: . atorvastatin  40 mg Oral q1800  . enoxaparin (LOVENOX) injection  40 mg Subcutaneous Q24H  . metoprolol tartrate  25 mg Oral Q8H  . olopatadine  1 drop Both Eyes BID  . pantoprazole  80 mg Oral Daily  . polyethylene glycol  17 g Oral Daily  . potassium chloride  40 mEq Oral Once  . sacubitril-valsartan  1 tablet Oral BID  . senna  1 tablet Oral BID  . sodium chloride flush  3 mL Intravenous Q12H  . sodium chloride flush  3 mL Intravenous Q12H   Continuous Infusions: . sodium chloride    . sodium chloride     PRN Meds: sodium chloride, sodium chloride, acetaminophen, ondansetron (ZOFRAN) IV, sodium chloride flush, sodium chloride flush   Vital Signs    Vitals:   12/04/18 2115 12/05/18 0504 12/05/18 0646 12/05/18 0758  BP: 113/72 125/81 (!) 159/92 139/84  Pulse:  60 64 (!) 57  Resp: 13 14    Temp:  98.1 F (36.7 C)  97.9 F (36.6 C)  TempSrc:  Oral  Oral  SpO2:  98%  99%  Weight:  83.1 kg    Height:        Intake/Output Summary (Last 24 hours) at 12/05/2018 0915 Last data filed at 12/05/2018 0836 Gross per 24 hour  Intake 960 ml  Output 700 ml  Net 260 ml   Filed Weights   12/02/18 2052 12/04/18 0633 12/05/18 0504  Weight: 85 kg 83 kg 83.1 kg    Telemetry    NSR - Personally Reviewed  ECG    NSR, LVH- Personally Reviewed  Physical Exam   GEN: No acute distress.   Neck: No JVD Cardiac: RRR, no murmurs, rubs, or gallops.  Respiratory: Clear to auscultation bilaterally. GI: Soft, nontender, non-distended  MS: No edema; No deformity. Rt radial site without hematoma Neuro:  Nonfocal  Psych: Normal affect   Labs    Chemistry Recent Labs  Lab 11/30/18 1230  12/03/18 0449 12/04/18 0126 12/05/18 0500  NA 136   < > 135 136 136  K 3.9   < > 3.9 4.2 3.9  CL 101   <  > 100 103 104  CO2 23   < > 25 25 25   GLUCOSE 107*   < > 116* 125* 100*  BUN 13   < > 17 15 18   CREATININE 0.91   < > 1.00 1.10 0.89  CALCIUM 9.2   < > 9.1 9.1 8.9  PROT 7.8  --   --   --   --   ALBUMIN 4.1  --   --   --   --   AST 27  --   --   --   --   ALT 26  --   --   --   --   ALKPHOS 78  --   --   --   --   BILITOT 0.9  --   --   --   --   GFRNONAA >60   < > >60 >60 >60  GFRAA >60   < > >60 >60 >60  ANIONGAP 12   < > 10 8 7    < > = values in this interval not displayed.     Hematology Recent Labs  Lab  12/02/18 0550 12/03/18 0449 12/04/18 0126  WBC 8.0 7.8 8.1  RBC 4.89 5.01 4.93  HGB 13.5 14.3 13.9  HCT 40.2 41.3 40.8  MCV 82.2 82.4 82.8  MCH 27.6 28.5 28.2  MCHC 33.6 34.6 34.1  RDW 12.7 12.8 12.9  PLT 208 230 217    Cardiac Enzymes Recent Labs  Lab 12/01/18 0756 12/04/18 0939 12/04/18 1550 12/04/18 2115  TROPONINI <0.03 <0.03 <0.03 <0.03   No results for input(s): TROPIPOC in the last 168 hours.   BNPNo results for input(s): BNP, PROBNP in the last 168 hours.   DDimer No results for input(s): DDIMER in the last 168 hours.   Radiology    CHEST - 2 VIEW  COMPARISON:  None.  FINDINGS: Mild cardiomegaly. No confluent airspace opacities, effusions or edema. No acute bony abnormality.  IMPRESSION: Mild cardiomegaly.  No active disease.   Electronically Signed   By: Charlett Nose M.D.  Cardiac Studies   Echo 12/01/18- Study Conclusions  - Left ventricle: The cavity size was at the upper limits of   normal. Systolic function was mildly to moderately reduced. The   estimated ejection fraction was in the range of 40% to 45%.   Diffuse hypokinesis. Possibly disproportionate hypokinesis of the   inferior myocardium; consistent with ischemia in the distribution   of the right coronary artery. Features are consistent with a   pseudonormal left ventricular filling pattern, with concomitant   abnormal relaxation and increased filling pressure  (grade 2   diastolic dysfunction). - Left atrium: The atrium was mildly dilated.   Patient Profile     53 y.o. male admitted with hypotension, bradycardia, and near syncope.   Assessment & Plan    Acute Combined CHF - Echo showed LVEF 40-45%, diffuse hypokinesis,  Grade 2 diastolic dysfunction also noted.  - Left heart catheterization showed normal coronary arteries with global LV systolic dysfunction. LVEDP normal.  - Discussed with MD. Will get cardiac MRI as an OP to rule out myocarditis.   Non-Sustained VT - Per chart review, patient had about 22 beats of non-sustained VT overnight on 12/02/2018. Patient also had 37 beats of non-sustained VT this morning around 8:30am. - EKG this morning around 8:45am showed normal sinus rhythm with no significant changes from prior tracings.  Near Syncope - Hypotensive and bradycardic initially.   Hypertension - Most recent BP 128/80. - change ARB to Entresto   Hyperlipidemia - Lipid panel this admission: Cholesterol 222, Triglycerides 251, HDL 34, LDL 138.  - Continue high-intensity statin.   Plan: OK for discharge- MRI can't be done till next- I will arrange as an OP. He will be seen in the office in 7-10 days- check BMP then.   [facility] will sign off.   Medication Recommendations:  See Med Rec Other recommendations (labs, testing, etc):  OP cardiac MRI Follow up as an outpatient:  Will be arranged  For questions or updates, please contact [facility] Please consult [email or web address] for contact info under        Signed, Corine Shelter, PA-C  12/05/2018, 9:15 AM    Personally seen and examined. Agree with above.  No significant shortness of breath, no chest pain, no syncope.  Ambulating well.  GEN: Well nourished, well developed, in no acute distress  HEENT: normal  Neck: no JVD, carotid bruits, or masses Cardiac: RRR; no murmurs, rubs, or gallops,no edema, cath site normal Respiratory:  clear to auscultation  bilaterally, normal work of breathing GI: soft, nontender, nondistended, +  BS MS: no deformity or atrophy  Skin: warm and dry, no rash Neuro:  Alert and Oriented x 3, Strength and sensation are intact Psych: euthymic mood, full affect  Echo EF 40 to 45%  Awaiting cardiac MRI.  Telemetry- occasional episodes of nonsustained ventricular tachycardia.  Assessment and plan:  Nonischemic cardiomyopathy -EF 40 to 45%, no coronary artery disease on catheterization.  Ejection fraction felt to be 30 to 35% on cardiac cath discrepant with slightly higher echocardiogram EF. - C-reactive protein was normal.  Sed rate was normal.  Low likelihood for myocarditis. -Continue with optimizing medical therapy.  We will go ahead and change angiotensin receptor blocker to low-dose Entresto.  Most recent blood pressure was in the 128/80 range.  -We will go ahead and consolidate his metoprolol tartrate to metoprolol succinate 100 mg once a day.    Nonsustained ventricular tachycardia - Consulting metoprolol to 100 mg once a day.  Hyperlipidemia - LDL 138, high intensity statin.  No coronary artery disease.  Agree that he is okay for discharge.  We can set up for outpatient MRI which will help clarify ejection fraction as well as rule out other inflammatory processes.    Donato Schultz, MD

## 2018-12-05 NOTE — Discharge Summary (Signed)
[facility] Discharge Summary  Patient name: Adam Hebert Medical record number: [id number] Date of birth: 1966/08/05 Age: 53 y.o. Gender: male Date of Admission: 11/30/2018  Date of Discharge: 12/05/2018 Admitting Physician: Dickie La, MD  Primary Care Provider: Shonto Bing, DO Consultants: cardiology  Indication for Hospitalization: new onset HFrEF  Discharge Diagnoses/Problem List:  Acute HFrEF eEF 30-35% Possible myocarditis (outpatient MRI to f/u) Non-sustained VT cHTN HLD GERD  Disposition: discharge home with cardiology f/u next week  Discharge Condition: stable  Discharge Exam:  (Copied from progress note on day of discharge) General: well nourished, well developed, in no acute distress with non-toxic appearance, lying comfortably in bed HEENT: normocephalic, atraumatic, moist mucous membranes Neck: supple, normal ROM CV: regular rate and rhythm without murmurs, rubs, or gallops, no lower extremity edema, 2+ radial and pedal pulses b/l Lungs: clear to auscultation bilaterally with normal work of breathing Abdomen: soft, non-tender, non-distended, normoactive bowel sounds Skin: warm, dry, no rashes or lesions Extremities: warm and well perfused, normal tone Neuro: Alert and oriented, speech normal although language barrier  Brief Hospital Course:  At PCP on 12/30 for cough/GERD/constipation and began to feel lightheaded. Had bradycardia and hypotension at that time. Admitted for further evaluation. ACS workup was negative. Echo showed new diagnosis of HFrEF eEF of 40-45% with diffuse hypokinesis, G2DD and mildly dilated LA. Patient also had several episodes of VT throughout admission with spontaneous resolution. Received heart cath 1/2 which showed: no epicardial cardia obstructive disease was demonstrated.  EF at cath was 30 to 35%; significantly more pronounced LV dysfunction then felt on echo when it was interpreted at 40 to  45%. CRP and ESR were normal but suspicion for myocarditis as cause of acute decrease in cardiac function remained high. Cardiology plans to perform cardiac MRI OP next week. Medical management was optimized prior to discharge with entresto, Toprol, and statin therapy. Vital signs were stable and patient symptoms had resolved prior to discharge. Denied chest pain, N/V, palpitations, diaphoresis, dizziness, cough.  Issues for Follow Up:  1. Cardiac MRI OP to r/u myocarditis as cause of acute HFrEF, f/u with cardiology OP in 7-10 days 2. Monitor electrolytes, magnesium and potassium low IP  Significant Procedures: echo 12/31, left heart cath 1/02  Significant Labs and Imaging:  Recent Labs  Lab 12/02/18 0550 12/03/18 0449 12/04/18 0126  WBC 8.0 7.8 8.1  HGB 13.5 14.3 13.9  HCT 40.2 41.3 40.8  PLT 208 230 217   Recent Labs  Lab 11/30/18 1230 11/30/18 1353 11/30/18 2003 12/01/18 0756 12/02/18 0550 12/03/18 0449 12/04/18 0126 12/05/18 0500  NA 136  --   --  136 137 135 136 136  K 3.9  --   --  3.7 3.7 3.9 4.2 3.9  CL 101  --   --  104 103 100 103 104  CO2 23  --   --  _0 GLUCOSE 107*  --   --  140* 99 116* 125* 100*  BUN 13  --   --  _1 CREATININE 0.91  --   --  0.91 0.96 1.00 1.10 0.89  CALCIUM 9.2  --   --  9.1 9.0 9.1 9.1 8.9  MG  --  2.0  --   --   --  1.9  --  2.1  PHOS  --   --  4.2  --   --   --   --   --  ALKPHOS 78  --   --   --   --   --   --   --   AST 27  --   --   --   --   --   --   --   ALT 26  --   --   --   --   --   --   --   ALBUMIN 4.1  --   --   --   --   --   --   --    ESR, CRP neg troponins <.03 x5 Magnesium 2.1 on day of discharge  Results/Tests Pending at Time of Discharge: none- needs f/u cardiac MRI OP  Discharge Medications:  Allergies as of 12/05/2018   No Known Allergies     Medication List    STOP taking these medications   amLODipine 5 MG tablet Commonly known as:  NORVASC   omeprazole 40 MG  capsule Commonly known as:  PRILOSEC     TAKE these medications   acetaminophen 500 MG tablet Commonly known as:  TYLENOL Take 1 tablet (500 mg total) by mouth every 6 (six) hours as needed.   atorvastatin 40 MG tablet Commonly known as:  LIPITOR Take 1 tablet (40 mg total) by mouth daily at 6 PM.   ibuprofen 800 MG tablet Commonly known as:  ADVIL,MOTRIN Take 800 mg by mouth every 8 (eight) hours as needed.   metoprolol succinate 100 MG 24 hr tablet Commonly known as:  TOPROL-XL Take 1 tablet (100 mg total) by mouth daily. Take with or immediately following a meal. Start taking on:  December 06, 2018   MULTIVITAMIN ADULT Chew Chew 1 tablet by mouth daily.   PATADAY 0.2 % Soln Generic drug:  Olopatadine HCl INSTILL 2 DROPS INTO EACH EYE DAILY What changed:  See the new instructions.   sacubitril-valsartan 24-26 MG Commonly known as:  ENTRESTO Take 1 tablet by mouth 2 (two) times daily.       Discharge Instructions: Please refer to Patient Instructions section of EMR for full details.  Patient was counseled important signs and symptoms that should prompt return to medical care, changes in medications, dietary instructions, activity restrictions, and follow up appointments.   Follow-Up Appointments: Follow-up Information    Troy Sine, MD Follow up.   Specialty:  Cardiology Why:  office will conatct you Contact information: [address] [address] [phone]           Richarda Osmond, DO 12/05/2018, 10:43 AM PGY-1, [place]

## 2018-12-17 ENCOUNTER — Encounter: Payer: Self-pay | Admitting: Physician Assistant

## 2018-12-17 ENCOUNTER — Telehealth: Payer: Self-pay | Admitting: Physician Assistant

## 2018-12-17 ENCOUNTER — Ambulatory Visit (INDEPENDENT_AMBULATORY_CARE_PROVIDER_SITE_OTHER): Payer: BLUE CROSS/BLUE SHIELD | Admitting: Physician Assistant

## 2018-12-17 VITALS — BP 128/78 | HR 65 | Ht 72.0 in | Wt 189.0 lb

## 2018-12-17 DIAGNOSIS — I4729 Other ventricular tachycardia: Secondary | ICD-10-CM

## 2018-12-17 DIAGNOSIS — I5021 Acute systolic (congestive) heart failure: Secondary | ICD-10-CM

## 2018-12-17 DIAGNOSIS — E785 Hyperlipidemia, unspecified: Secondary | ICD-10-CM | POA: Diagnosis not present

## 2018-12-17 DIAGNOSIS — I10 Essential (primary) hypertension: Secondary | ICD-10-CM | POA: Diagnosis not present

## 2018-12-17 DIAGNOSIS — I472 Ventricular tachycardia: Secondary | ICD-10-CM | POA: Diagnosis not present

## 2018-12-17 MED ORDER — SACUBITRIL-VALSARTAN 49-51 MG PO TABS: 1.0000 | ORAL_TABLET | Freq: Two times a day (BID) | ORAL | 5 refills | Status: DC

## 2018-12-17 NOTE — Telephone Encounter (Signed)
Returned call. Pt speaks little english but gave verbal permission to speak with his sister. Adv them that I do not see any documentation that our office was trying to reach the pt. Pt sister reviewed the call log and sts that our office number is not listed. I asked if the pt had any questions regarding his o/v today. Pt sister sts that he does not. No action required.

## 2018-12-17 NOTE — Patient Instructions (Signed)
Medication Instructions:  Your physician has recommended you make the following change in your medication:   INCREASE Entresto to 49/51mg  twice daily. An Rx has been sent to your pharmacy    If you need a refill on your cardiac medications before your next appointment, please call your pharmacy.   Lab work: Your physician recommends that you return for lab work in: 2 weeks (Bmet)  If you have labs (blood work) drawn today and your tests are completely normal, you will receive your results only by: Marland Kitchen MyChart Message (if you have MyChart) OR . A paper copy in the mail If you have any lab test that is abnormal or we need to change your treatment, we will call you to review the results.  Testing/Procedures: Your physician has recommended that you wear an event monitor. Event monitors are medical devices that record the heart's electrical activity. Doctors most often [place] these monitors to diagnose arrhythmias. Arrhythmias are problems with the speed or rhythm of the heartbeat. The monitor is a small, portable device. You can wear one while you do your normal daily activities. This is usually used to diagnose what is causing palpitations/syncope (passing out).  You will be contacted to schedule your Cardiac MRI  Follow-Up: At [facility], you and your health needs are our priority.  As part of our continuing mission to provide you with exceptional heart care, we have created designated Provider Care Teams.  These Care Teams include your primary Cardiologist (physician) and Advanced Practice Providers (APPs -  Physician Assistants and Nurse Practitioners) who all work together to provide you with the care you need, when you need it. You will need a follow up appointment in 3 months with Dr.Kelly.

## 2018-12-17 NOTE — Telephone Encounter (Signed)
New message   Patient is returning a call.

## 2018-12-17 NOTE — Progress Notes (Signed)
Cardiology Office Note    Date:  12/19/2018   ID:  Adam Shinebdelbagi Abdalla Hebert, DOB 04/11/1966, MRN [id number]  PCP:  Wendee BeaversMcMullen, David J, DO  Cardiologist: Dr. Tresa EndoKelly  Chief Complaint  Patient presents with  . Follow-up    seen for Dr. Tresa EndoKelly.     History of Present Illness:  Adam Hebert is a 53 y.o. male from [place] who speak Arabic with past medical history of hypertension and hyperlipidemia who was recently presented to the hospital with near syncope and hypotension.  He had bradycardia with heart rate of 40s.  Serial troponin was negative.  Echocardiogram obtained in the hospital showed EF of 40 to 45%, diffuse hypokinesis, possible disproportionate hypokinesis of the inferior myocardium, consistent with ischemia in the RCA distribution, grade 2 DD.  He eventually underwent cardiac catheterization on 12/03/2018 which showed angiographically normal coronaries, EF by LV gram was 30 to 35%, LVEDP was normal.  It was recommended to obtain cardiac MRI as outpatient to rule out myocarditis.  During the hospitalization, patient had a 22 beats of nonsustained VT this was followed by another 37 beats of nonsustained VT.  He was started on Entresto during the hospitalization.  He was also placed on Toprol-XL 100 mg daily.  Patient returns for follow-up today.  He denies any further dizziness or presyncope.  He has not had any chest pain or shortness of breath.  He has been compliant with his Toprol-XL, Entresto and Lipitor.  I will increase his Entresto to 49-51 mg twice daily.  He will need 1 week basic metabolic panel.  He has outpatient cardiac MRI arranged.  He will also need a 30-day event monitor given the long nonsustained VT that was seen in the hospitalization.  Otherwise he can follow-up in 3 months.  He does not have any heart failure symptoms.  I will defer to Dr. Tresa EndoKelly to order echocardiogram in 3 months.  Interview was conducted with Arabic translator: By Mr. Virgel BouquetJameel Ali  Past Medical  History:  Diagnosis Date  . High cholesterol   . Hypertension     Past Surgical History:  Procedure Laterality Date  . LEFT HEART CATH AND CORONARY ANGIOGRAPHY N/A 12/03/2018   Procedure: LEFT HEART CATH AND CORONARY ANGIOGRAPHY;  Surgeon: Lyn RecordsSmith, Henry W, MD;  Location: [facility];  Service: Cardiovascular;  Laterality: N/A;    Current Medications: Outpatient Medications Prior to Visit  Medication Sig Dispense Refill  . atorvastatin (LIPITOR) 40 MG tablet Take 1 tablet (40 mg total) by mouth daily at 6 PM. 30 tablet 0  . metoprolol succinate (TOPROL-XL) 100 MG 24 hr tablet Take 1 tablet (100 mg total) by mouth daily. Take with or immediately following a meal. 30 tablet 0  . Multiple Vitamins-Minerals (MULTIVITAMIN ADULT) CHEW Chew 1 tablet by mouth daily. 90 tablet 3  . sacubitril-valsartan (ENTRESTO) 24-26 MG Take 1 tablet by mouth 2 (two) times daily. 60 tablet 0  . acetaminophen (TYLENOL) 500 MG tablet Take 1 tablet (500 mg total) by mouth every 6 (six) hours as needed. 90 tablet 3  . ibuprofen (ADVIL,MOTRIN) 800 MG tablet Take 800 mg by mouth every 8 (eight) hours as needed.    Marland Kitchen. PATADAY 0.2 % SOLN INSTILL 2 DROPS INTO EACH EYE DAILY (Patient taking differently: Place 2 drops into both eyes daily. ) 2.5 mL 2   No facility-administered medications prior to visit.      Allergies:   Patient has no known allergies.   Social History  Socioeconomic History  . Marital status: Married    Spouse name: Not on file  . Number of children: Not on file  . Years of education: Not on file  . Highest education level: Not on file  Occupational History  . Not on file  Social Needs  . Financial resource strain: Not on file  . Food insecurity:    Worry: Not on file    Inability: Not on file  . Transportation needs:    Medical: Not on file    Non-medical: Not on file  Tobacco Use  . Smoking status: Never Smoker  . Smokeless tobacco: Never Used  Substance and Sexual Activity  .  Alcohol use: Never    Alcohol/week: 0.0 standard drinks    Frequency: Never  . Drug use: Never  . Sexual activity: Not on file  Lifestyle  . Physical activity:    Days per week: Not on file    Minutes per session: Not on file  . Stress: Not on file  Relationships  . Social connections:    Talks on phone: Not on file    Gets together: Not on file    Attends religious service: Not on file    Active member of club or organization: Not on file    Attends meetings of clubs or organizations: Not on file    Relationship status: Not on file  Other Topics Concern  . Not on file  Social History Narrative  . Not on file     Family History:  The patient's family history is not on file.   ROS:   Please see the history of present illness.    ROS All other systems reviewed and are negative.   PHYSICAL EXAM:   VS:  BP 128/78   Pulse 65   Ht 6' (1.829 m)   Wt 189 lb (85.7 kg)   SpO2 99%   BMI 25.63 kg/m    GEN: Well nourished, well developed, in no acute distress  HEENT: normal  Neck: no JVD, carotid bruits, or masses Cardiac: RRR; no murmurs, rubs, or gallops,no edema  Respiratory:  clear to auscultation bilaterally, normal work of breathing GI: soft, nontender, nondistended, + BS MS: no deformity or atrophy  Skin: warm and dry, no rash Neuro:  Alert and Oriented x 3, Strength and sensation are intact Psych: euthymic mood, full affect  Wt Readings from Last 3 Encounters:  12/17/18 189 lb (85.7 kg)  12/05/18 183 lb 3.2 oz (83.1 kg)  11/30/18 189 lb 3.2 oz (85.8 kg)      Studies/Labs Reviewed:   EKG:  EKG is not ordered today.    Recent Labs: 11/30/2018: ALT 26; TSH 2.111 12/04/2018: Hemoglobin 13.9; Platelets 217 12/05/2018: BUN 18; Creatinine, Ser 0.89; Magnesium 2.1; Potassium 3.9; Sodium 136   Lipid Panel    Component Value Date/Time   CHOL 222 (H) 12/01/2018 0756   CHOL 230 (H) 06/08/2018 0936   TRIG 251 (H) 12/01/2018 0756   HDL 34 (L) 12/01/2018 0756   HDL 37  (L) 06/08/2018 0936   CHOLHDL 6.5 12/01/2018 0756   VLDL 50 (H) 12/01/2018 0756   LDLCALC 138 (H) 12/01/2018 0756   LDLCALC 154 (H) 06/08/2018 0936    Additional studies/ records that were reviewed today include:   Echo 12/01/2018 LV EF: 40% -   45%  ------------------------------------------------------------------- Indications:      Syncope 780.2.  ------------------------------------------------------------------- History:   Risk factors:  Hypertension. Dyslipidemia.  ------------------------------------------------------------------- Study Conclusions  -  Left ventricle: The cavity size was at the upper limits of   normal. Systolic function was mildly to moderately reduced. The   estimated ejection fraction was in the range of 40% to 45%.   Diffuse hypokinesis. Possibly disproportionate hypokinesis of the   inferior myocardium; consistent with ischemia in the distribution   of the right coronary artery. Features are consistent with a   pseudonormal left ventricular filling pattern, with concomitant   abnormal relaxation and increased filling pressure (grade 2   diastolic dysfunction). - Left atrium: The atrium was mildly dilated.   Cath 12/03/2018  Right coronary dominance.  Angiographically normal coronary arteries.  Global left ventricular systolic dysfunction with EF estimated in the 30 to 35% range.  LVEDP is normal.  Findings are consistent with chronic systolic heart failure versus acute systolic heart failure with compensated hemodynamics.  RECOMMENDATIONS:   Guideline directed therapy for left ventricular systolic dysfunction: ARNI, beta-blocker, and MRA as tolerated by blood pressure.  Consider myocarditis as etiology for reduced EF.  Check screening inflammatory markers (CRP/sed rate) and rule out sarcoid.    ASSESSMENT:    1. Acute systolic heart failure (HCC)   2. Paroxysmal ventricular tachycardia (HCC)   3. Essential hypertension   4.  Hyperlipidemia, unspecified hyperlipidemia type      PLAN:  In order of problems listed above:  1. Acute systolic heart failure: Recent echocardiogram showed a drop in ejection fraction.  Cardiac catheterization revealed normal coronaries.  Disease is nonischemic cardiomyopathy.  Patient is now on Toprol-XL and Entresto.  I will further increase Entresto dosage to 49-51 mg twice daily.  I will defer to Dr. Tresa Endo to repeat a echocardiogram in 61-month.  Unclear cause further drop in EF, patient is pending cardiac MRI to rule out infiltrative heart disease  2. Paroxysmal ventricular tachycardia: Currently on beta-blocker, given the frequency of ventricular tachycardia in the hospital, I will obtain a 30-day event monitor.  3. Hypertension: Blood pressure stable on current medication, I think there is still room to increase on the Entresto dosage to 49-51 mg twice daily.  Will obtain basic metabolic panel in 1 to 2 weeks  4. Hyperlipidemia: Continue Lipitor.    Medication Adjustments/Labs and Tests Ordered: Current medicines are reviewed at length with the patient today.  Concerns regarding medicines are outlined above.  Medication changes, Labs and Tests ordered today are listed in the Patient Instructions below. Patient Instructions  Medication Instructions:  Your physician has recommended you make the following change in your medication:   INCREASE Entresto to 49/51mg  twice daily. An Rx has been sent to your pharmacy    If you need a refill on your cardiac medications before your next appointment, please call your pharmacy.   Lab work: Your physician recommends that you return for lab work in: 2 weeks (Bmet)  If you have labs (blood work) drawn today and your tests are completely normal, you will receive your results only by: Marland Kitchen MyChart Message (if you have MyChart) OR . A paper copy in the mail If you have any lab test that is abnormal or we need to change your treatment, we will  call you to review the results.  Testing/Procedures: Your physician has recommended that you wear an event monitor. Event monitors are medical devices that record the heart's electrical activity. Doctors most often [place] these monitors to diagnose arrhythmias. Arrhythmias are problems with the speed or rhythm of the heartbeat. The monitor is a small, portable device. You can wear  one while you do your normal daily activities. This is usually used to diagnose what is causing palpitations/syncope (passing out).  You will be contacted to schedule your Cardiac MRI  Follow-Up: At [facility], you and your health needs are our priority.  As part of our continuing mission to provide you with exceptional heart care, we have created designated Provider Care Teams.  These Care Teams include your primary Cardiologist (physician) and Advanced Practice Providers (APPs -  Physician Assistants and Nurse Practitioners) who all work together to provide you with the care you need, when you need it. You will need a follow up appointment in 3 months with Dr.Kelly.      Ramond Dial, [place]  12/19/2018 11:31 PM    [facility] [address] [place], [place], [place]  [address] Phone: [phone]; Fax: [phone]

## 2018-12-18 ENCOUNTER — Telehealth: Payer: Self-pay | Admitting: Physician Assistant

## 2018-12-18 NOTE — Telephone Encounter (Signed)
Patient's daughter is called wanting to know if a letter can be written stating her father can return back to work.  Patient saw Azalee Course yesterday.

## 2018-12-18 NOTE — Telephone Encounter (Signed)
Returned call to patient's daughter Adam Hebert.She stated father saw Azalee Course PA yesterday.Stated they received a letter that he had appointment yesterday.Stated letter needs to say what day he can return to work and if any restrictions.Advised Wynema Birch is out of office today.I will send message to him.

## 2018-12-19 ENCOUNTER — Encounter: Payer: Self-pay | Admitting: Physician Assistant

## 2018-12-20 NOTE — Telephone Encounter (Signed)
Patient had normal coronaries. Given the weakened heart, I would recommend no lifting > 30 lbs, otherwise, no restriction

## 2018-12-21 ENCOUNTER — Telehealth: Payer: Self-pay | Admitting: Physician Assistant

## 2018-12-21 NOTE — Telephone Encounter (Signed)
See previous telephone note 12/22/18.

## 2018-12-21 NOTE — Telephone Encounter (Signed)
New Message          Patient's daughter is today to get letter that states the patient can go back to work and what are the restrictions if any. There is a telephone note in there that states the patient shouldn't lift over 30 pds, but that all I seen. Pls call and advise. They would like the note today.

## 2018-12-21 NOTE — Telephone Encounter (Signed)
Returned call to patient's daughter.Hao's advice given.Return to work letter done advising no lifting greater than 30 lbs,otherwise no restrictions.

## 2018-12-23 ENCOUNTER — Encounter: Payer: Self-pay | Admitting: Physician Assistant

## 2018-12-24 ENCOUNTER — Telehealth: Payer: Self-pay | Admitting: Physician Assistant

## 2018-12-24 NOTE — Telephone Encounter (Signed)
Patient letter was given in office.

## 2018-12-24 NOTE — Telephone Encounter (Signed)
New message    Pt will be coming in today to pick up return to work letter from [employer].

## 2018-12-25 ENCOUNTER — Telehealth (HOSPITAL_COMMUNITY): Payer: Self-pay | Admitting: Emergency Medicine

## 2018-12-25 NOTE — Telephone Encounter (Signed)
Reaching out to patient to offer assistance regarding upcoming cardiac imaging study; pt verbalizes understanding of appt date/time, parking situation and where to check in, and verified current allergies; name and call back number provided for further questions should they arise Kaylena Pacifico RN Navigator Cardiac Imaging [facility] [phone] office [phone] cell 

## 2018-12-27 ENCOUNTER — Other Ambulatory Visit (HOSPITAL_COMMUNITY): Payer: Self-pay | Admitting: Family Medicine

## 2018-12-28 ENCOUNTER — Ambulatory Visit (HOSPITAL_COMMUNITY)
Admission: RE | Admit: 2018-12-28 | Discharge: 2018-12-28 | Disposition: A | Discharge status: Home / Self Care | Payer: BLUE CROSS/BLUE SHIELD | Source: Ambulatory Visit | Attending: Cardiology | Admitting: Cardiology

## 2018-12-28 DIAGNOSIS — I517 Cardiomegaly: Secondary | ICD-10-CM | POA: Insufficient documentation

## 2018-12-28 MED ORDER — GADOBUTROL 1 MMOL/ML IV SOLN
9.0000 mL | Freq: Once | INTRAVENOUS | Status: AC | PRN
  Administered 2018-12-28: 9 mL via INTRAVENOUS

## 2019-01-01 ENCOUNTER — Encounter: Payer: Self-pay | Admitting: Cardiovascular Disease

## 2019-01-04 LAB — BASIC METABOLIC PANEL
BUN/Creatinine Ratio: 15 (ref 9–20)
BUN: 15 mg/dL (ref 6–24)
CO2: 22 mmol/L (ref 20–29)
CREATININE: 1 mg/dL (ref 0.76–1.27)
Calcium: 9 mg/dL (ref 8.7–10.2)
Chloride: 101 mmol/L (ref 96–106)
GFR calc Af Amer: 98 mL/min/{1.73_m2} (ref >59)
GFR calc non Af Amer: 85 mL/min/{1.73_m2} (ref >59)
Glucose: 134 mg/dL — ABNORMAL HIGH (ref 65–99)
Potassium: 4.2 mmol/L (ref 3.5–5.2)
Sodium: 138 mmol/L (ref 134–144)

## 2019-01-08 ENCOUNTER — Telehealth: Payer: Self-pay | Admitting: Cardiovascular Disease

## 2019-01-08 NOTE — Telephone Encounter (Signed)
See below

## 2019-01-08 NOTE — Telephone Encounter (Signed)
Called patient, advised that I was unaware of what paperwork he was regarding. Gave call back number to call to discuss.

## 2019-01-08 NOTE — Telephone Encounter (Signed)
New Message   Patient calling to see if work form has been filled out so he can pick it up.

## 2019-01-15 NOTE — Telephone Encounter (Signed)
Called patient, spoke with daughter. She states they have everything they need for him to return to work. Nothing else for [place] to do at this time.

## 2019-01-18 ENCOUNTER — Encounter: Payer: Self-pay | Admitting: Physician Assistant

## 2019-01-18 ENCOUNTER — Ambulatory Visit (INDEPENDENT_AMBULATORY_CARE_PROVIDER_SITE_OTHER): Payer: BLUE CROSS/BLUE SHIELD

## 2019-01-18 DIAGNOSIS — I472 Ventricular tachycardia: Secondary | ICD-10-CM | POA: Diagnosis not present

## 2019-01-18 DIAGNOSIS — I4729 Other ventricular tachycardia: Secondary | ICD-10-CM

## 2019-01-19 ENCOUNTER — Telehealth: Payer: Self-pay | Admitting: Physician Assistant

## 2019-01-19 NOTE — Telephone Encounter (Signed)
Received notification from [employer] that patient had a 10-beat run of NSVT at 0554 this morning that spontaneously converted to NSR. Pt did not indicate symptoms at that time. Pt called by [employer] and myself without answer. I left a voicemail for the patient to return call to the office.

## 2019-01-20 ENCOUNTER — Telehealth: Payer: Self-pay

## 2019-01-20 ENCOUNTER — Telehealth: Payer: Self-pay | Admitting: Cardiovascular Disease

## 2019-01-20 NOTE — Telephone Encounter (Signed)
Received notification from [employer] that patient had a 10 beat run of Sinus Rhythm of Run of V-TACH. Patient was contact last night when episode occurred, and PA was unable to reach patient and left a message. I called patient this morning to ask how he was feeling. Patient verbalized that he was okay, denied chest pain current, and patient also states that he felt fine yesterday. I advised with Dr. Jacques Navy DOD today, she signed off that patient as asymptomatic, and no changes at this time due to patient's HR and BP in normal range. Patient was advised to call back if any noted issues of CP or other symptoms.  Will place in Dr.Kelly's box to make him aware.

## 2019-01-20 NOTE — Telephone Encounter (Signed)
New Message    1. Is this related to a heart monitor you are wearing?  (If the patient says no, please ask     if they are caling about ICD/pacemaker.) Yes   2. What is your issue?? Patient has questions about the monitor itself. (If the patient is calling for results of the heart monitor this     message should be sent to nurse.)  Patient's daughter states she will call back tomorrow. Please route to covering RN/CMA/RMA for results. Route to monitor technicians or your monitor tech representative for your site for any technical concerns

## 2019-01-31 NOTE — Telephone Encounter (Signed)
Called for 10 beat run of NSVT on event monitor.

## 2019-02-01 NOTE — Telephone Encounter (Signed)
I have not yet seen this patient.  The monitor was ordered by Azalee Course, PAC

## 2019-02-03 ENCOUNTER — Telehealth: Payer: Self-pay

## 2019-02-03 NOTE — Telephone Encounter (Signed)
Patient's daughter Ashvin Dohner, who is on the patient's DPR called back with her father, Mr. Nektarios Penninger near to schedule the appointment. Daughter stated that the 4:30 timeslots would not work for her father that the 56 PM would. I explained the urgency of getting him to come in sooner the patient and daughter verbalized understanding and all (if any) questions were answered. Gave address for the office to the daughter. Told her and Mr. Krom to give the office a call if they have any questions or concerns

## 2019-02-03 NOTE — Telephone Encounter (Signed)
Received monitor strip reporting pt had 6 beat run of NSVT around 2:40am pm 3/3. Contacted pt who states he was getting off of work around that time and only symptoms was feeling a little tired. Strip reviewed by Dr. Tresa Endo (DOD) who recommends pt schedule an appointment to been seen in office. No opened appointments available. Per Azalee Course, PA he is ok with adding pt to schedule. Will defer call to his CMA.

## 2019-02-03 NOTE — Telephone Encounter (Signed)
Called and left a detailed message for the patient to call back ASAP so that I can get him on the schedule for Azalee Course, PA-C for Friday 02-05-2019 at 4:30PM or Monday 02-08-2019 at 12 or 4

## 2019-02-08 ENCOUNTER — Encounter: Payer: Self-pay | Admitting: Physician Assistant

## 2019-02-08 ENCOUNTER — Ambulatory Visit: Payer: BLUE CROSS/BLUE SHIELD | Admitting: Physician Assistant

## 2019-02-08 VITALS — BP 136/84 | HR 74 | Ht 66.0 in | Wt 185.0 lb

## 2019-02-08 DIAGNOSIS — I4729 Other ventricular tachycardia: Secondary | ICD-10-CM

## 2019-02-08 DIAGNOSIS — I428 Other cardiomyopathies: Secondary | ICD-10-CM | POA: Diagnosis not present

## 2019-02-08 DIAGNOSIS — E785 Hyperlipidemia, unspecified: Secondary | ICD-10-CM

## 2019-02-08 DIAGNOSIS — I10 Essential (primary) hypertension: Secondary | ICD-10-CM

## 2019-02-08 DIAGNOSIS — I472 Ventricular tachycardia: Secondary | ICD-10-CM

## 2019-02-08 MED ORDER — ATORVASTATIN CALCIUM 40 MG PO TABS: 40.0000 mg | ORAL_TABLET | Freq: Every day | ORAL | 3 refills | Status: DC

## 2019-02-08 MED ORDER — METOPROLOL SUCCINATE ER 50 MG PO TB24: 50.0000 mg | ORAL_TABLET | Freq: Every day | ORAL | 1 refills | Status: DC

## 2019-02-08 NOTE — Patient Instructions (Addendum)
Medication Instructions:  RESTART LIPITOR 40 MG DAILY  START TAKING Metoprolol Succinate 50 MG DAILY   If you need a refill on your cardiac medications before your next appointment, please call your pharmacy.   Lab work: NONE   If you have labs (blood work) drawn today and your tests are completely normal, you will receive your results only by: Marland Kitchen MyChart Message (if you have MyChart) OR . A paper copy in the mail If you have any lab test that is abnormal or we need to change your treatment, we will call you to review the results.  Testing/Procedures: NONE  Follow-Up: . Your physician recommends that you KEEP your follow-up appointment with Azalee Course, PA-C on Friday April 3 at 8 AM.   Any Other Special Instructions Will Be Listed Below (If Applicable). NONE

## 2019-02-08 NOTE — Progress Notes (Signed)
Cardiology Office Note    Date:  02/10/2019   ID:  Adam Hebert, Adam Hebert [birth year], MRN [id number]  PCP:  Lennette Bihari, MD  Cardiologist:  Dr. Tresa Endo   Chief Complaint  Patient presents with  . Follow-up    seen for Dr. Tresa Endo    History of Present Illness:  Adam Hebert is a 53 y.o. male from [place] who speak Arabic with past medical history of hypertension and hyperlipidemia who was recently presented to the hospital with near syncope and hypotension.  He had bradycardia with heart rate of 40s.  Serial troponin was negative.  Echocardiogram obtained in the hospital showed EF of 40 to 45%, diffuse hypokinesis, possible disproportionate hypokinesis of the inferior myocardium, consistent with ischemia in the RCA distribution, grade 2 DD.  He eventually underwent cardiac catheterization on 12/03/2018 which showed angiographically normal coronaries, EF by LV gram was 30 to 35%, LVEDP was normal.  It was recommended to obtain cardiac MRI as outpatient to rule out myocarditis.  During the hospitalization, patient had a 22 beats of nonsustained VT this was followed by another 37 beats of nonsustained VT.  He was started on Entresto during the hospitalization.  He was also placed on Toprol-XL 100 mg daily.  I last saw the patient on 12/17/2018.  I increase his Entresto to 49-51 mg twice daily.  Outpatient cardiac MRI showed EF 42%, severe inferior hypokinesis, allergy pattern does not appears to be myocarditis.Marland Kitchen  He also needed a 30-day event monitor given episodes of nonsustained VT. talking with the patient, apparently he has stopped all of his cardiac medication recently.  He was under the impression he should finish the course he had on hand and then stop after that.  His blood pressure surprisingly is very much normal despite the fact that he is not on his heart failure medication.  I will restart him his Toprol-XL at the lower dose at 50 mg daily.  I have filled out the medication  assistance form for his Sherryll Burger, once that is approved, I plan to restart Entresto at 24-26 mg twice daily.  I have also restarted his Lipitor as well.  I will see him back in 2 to 3 weeks for medication titration.  His heart failure seems to be well compensated without sign of volume overload.  Interview was conducted using Arabic translator Mrs Clemens Catholic   Past Medical History:  Diagnosis Date  . High cholesterol   . Hypertension     Past Surgical History:  Procedure Laterality Date  . LEFT HEART CATH AND CORONARY ANGIOGRAPHY N/A 12/03/2018   Procedure: LEFT HEART CATH AND CORONARY ANGIOGRAPHY;  Surgeon: Lyn Records, MD;  Location: [facility];  Service: Cardiovascular;  Laterality: N/A;    Current Medications: Outpatient Medications Prior to Visit  Medication Sig Dispense Refill  . sacubitril-valsartan (ENTRESTO) 49-51 MG Take 1 tablet by mouth 2 (two) times daily. 60 tablet 5  . atorvastatin (LIPITOR) 40 MG tablet Take 1 tablet (40 mg total) by mouth daily at 6 PM. (Patient not taking: Reported on 02/08/2019) 30 tablet 0  . metoprolol succinate (TOPROL-XL) 100 MG 24 hr tablet Take 1 tablet (100 mg total) by mouth daily. Take with or immediately following a meal. (Patient not taking: Reported on 02/08/2019) 30 tablet 0  . Multiple Vitamins-Minerals (MULTIVITAMIN ADULT) CHEW Chew 1 tablet by mouth daily. (Patient not taking: Reported on 02/08/2019) 90 tablet 3   No facility-administered medications prior to visit.  Allergies:   Patient has no known allergies.   Social History   Socioeconomic History  . Marital status: Married    Spouse name: Not on file  . Number of children: Not on file  . Years of education: Not on file  . Highest education level: Not on file  Occupational History  . Not on file  Social Needs  . Financial resource strain: Not on file  . Food insecurity:    Worry: Not on file    Inability: Not on file  . Transportation needs:    Medical:  Not on file    Non-medical: Not on file  Tobacco Use  . Smoking status: Never Smoker  . Smokeless tobacco: Never Used  Substance and Sexual Activity  . Alcohol use: Never    Alcohol/week: 0.0 standard drinks    Frequency: Never  . Drug use: Never  . Sexual activity: Not on file  Lifestyle  . Physical activity:    Days per week: Not on file    Minutes per session: Not on file  . Stress: Not on file  Relationships  . Social connections:    Talks on phone: Not on file    Gets together: Not on file    Attends religious service: Not on file    Active member of club or organization: Not on file    Attends meetings of clubs or organizations: Not on file    Relationship status: Not on file  Other Topics Concern  . Not on file  Social History Narrative  . Not on file     Family History:  The patient's family history is not on file.   ROS:   Please see the history of present illness.    ROS All other systems reviewed and are negative.   PHYSICAL EXAM:   VS:  BP 136/84   Pulse 74   Ht 5\' 6"  (1.676 m)   Wt 185 lb (83.9 kg)   BMI 29.86 kg/m    GEN: Well nourished, well developed, in no acute distress  HEENT: normal  Neck: no JVD, carotid bruits, or masses Cardiac: RRR; no murmurs, rubs, or gallops,no edema  Respiratory:  clear to auscultation bilaterally, normal work of breathing GI: soft, nontender, nondistended, + BS MS: no deformity or atrophy  Skin: warm and dry, no rash Neuro:  Alert and Oriented x 3, Strength and sensation are intact Psych: euthymic mood, full affect  Wt Readings from Last 3 Encounters:  02/08/19 185 lb (83.9 kg)  12/17/18 189 lb (85.7 kg)  12/05/18 183 lb 3.2 oz (83.1 kg)      Studies/Labs Reviewed:   EKG:  EKG is ordered today.  The ekg ordered today demonstrates normal sinus rhythm with nonspecific T wave changes  Recent Labs: 11/30/2018: ALT 26; TSH 2.111 12/04/2018: Hemoglobin 13.9; Platelets 217 12/05/2018: Magnesium 2.1 01/04/2019: BUN  15; Creatinine, Ser 1.00; Potassium 4.2; Sodium 138   Lipid Panel    Component Value Date/Time   CHOL 222 (H) 12/01/2018 0756   CHOL 230 (H) 06/08/2018 0936   TRIG 251 (H) 12/01/2018 0756   HDL 34 (L) 12/01/2018 0756   HDL 37 (L) 06/08/2018 0936   CHOLHDL 6.5 12/01/2018 0756   VLDL 50 (H) 12/01/2018 0756   LDLCALC 138 (H) 12/01/2018 0756   LDLCALC 154 (H) 06/08/2018 0936    Additional studies/ records that were reviewed today include:   Echo 12/01/2018 LV EF: 40% -   45% Study Conclusions  - Left  ventricle: The cavity size was at the upper limits of   normal. Systolic function was mildly to moderately reduced. The   estimated ejection fraction was in the range of 40% to 45%.   Diffuse hypokinesis. Possibly disproportionate hypokinesis of the   inferior myocardium; consistent with ischemia in the distribution   of the right coronary artery. Features are consistent with a   pseudonormal left ventricular filling pattern, with concomitant   abnormal relaxation and increased filling pressure (grade 2   diastolic dysfunction). - Left atrium: The atrium was mildly dilated.   Cath 12/03/2018  Right coronary dominance.  Angiographically normal coronary arteries.  Global left ventricular systolic dysfunction with EF estimated in the 30 to 35% range.  LVEDP is normal.  Findings are consistent with chronic systolic heart failure versus acute systolic heart failure with compensated hemodynamics.  RECOMMENDATIONS:   Guideline directed therapy for left ventricular systolic dysfunction: ARNI, beta-blocker, and MRA as tolerated by blood pressure.  Consider myocarditis as etiology for reduced EF.  Check screening inflammatory markers (CRP/sed rate) and rule out sarcoid.   ASSESSMENT:    1. Paroxysmal ventricular tachycardia (HCC)   2. Essential hypertension   3. Hyperlipidemia, unspecified hyperlipidemia type   4. NICM (nonischemic cardiomyopathy) (HCC)      PLAN:  In order  of problems listed above:  1. Paroxysmal ventricular tachycardia: Seen on recent heart monitor.  Apparently patient has self discontinued his metoprolol.  Will restart Toprol-XL at 50 mg daily.  2. Nonischemic cardiomyopathy: Well compensated based on physical exam.  Restart Toprol-XL.  Sherryll Burger is too expensive for him, we will fill out medication assistance form.  If able to obtain the Entresto at lower cost, will restart it at 24-26 mg twice daily.  However if unable to afford Entresto, will start on losartan on the next office visit  3. Hyperlipidemia: He has self discontinued Lipitor as well.  Will restart Lipitor.  4. Hypertension: He essentially stopped all of his cardiac medication.  Will restart the blood pressure medication at a lower dose as his blood pressure is not that high even on no medication at all.   Medication Adjustments/Labs and Tests Ordered: Current medicines are reviewed at length with the patient today.  Concerns regarding medicines are outlined above.  Medication changes, Labs and Tests ordered today are listed in the Patient Instructions below. Patient Instructions  Medication Instructions:  RESTART LIPITOR 40 MG DAILY  START TAKING Metoprolol Succinate 50 MG DAILY   If you need a refill on your cardiac medications before your next appointment, please call your pharmacy.   Lab work: NONE   If you have labs (blood work) drawn today and your tests are completely normal, you will receive your results only by: Marland Kitchen MyChart Message (if you have MyChart) OR . A paper copy in the mail If you have any lab test that is abnormal or we need to change your treatment, we will call you to review the results.  Testing/Procedures: NONE  Follow-Up: . Your physician recommends that you KEEP your follow-up appointment with Azalee Course, PA-C on Friday April 3 at 8 AM.   Any Other Special Instructions Will Be Listed Below (If Applicable). [address]      Ramond Dial, [place]    02/10/2019 11:32 PM    [facility] [address] [place], [place], [place]  [address] Phone: [phone]; Fax: [phone]

## 2019-02-10 ENCOUNTER — Encounter: Payer: Self-pay | Admitting: Physician Assistant

## 2019-03-04 ENCOUNTER — Telehealth: Payer: Self-pay

## 2019-03-04 NOTE — Telephone Encounter (Signed)
Virtual Visit Pre-Appointment Phone Call  Steps For Call:  1. Confirm consent - "In the setting of the current Covid19 crisis, you are scheduled for a (phone or video) visit with your provider on (date) at (time).  Just as we do with many in-office visits, in order for you to participate in this visit, we must obtain consent.  If you'd like, I can send this to your mychart (if signed up) or email for you to review.  Otherwise, I can obtain your verbal consent now.  All virtual visits are billed to your insurance company just like a normal visit would be.  By agreeing to a virtual visit, we'd like you to understand that the technology does not allow for your provider to perform an examination, and thus may limit your provider's ability to fully assess your condition.  Finally, though the technology is pretty good, we cannot assure that it will always work on either your or our end, and in the setting of a video visit, we may have to convert it to a phone-only visit.  In either situation, we cannot ensure that we have a secure connection.  Are you willing to proceed?"  2. Give patient instructions for WebEx download to smartphone as below if video visit  3. Advise patient to be prepared with any vital sign or heart rhythm information, their current medicines, and a piece of paper and pen handy for any instructions they may receive the day of their visit  4. Inform patient they will receive a phone call 15 minutes prior to their appointment time (may be from unknown caller ID) so they should be prepared to answer  5. Confirm that appointment type is correct in Epic appointment notes (video vs telephone)    TELEPHONE CALL NOTE  Adam Hebert has been deemed a candidate for a follow-up tele-health visit to limit community exposure during the Covid-19 pandemic. I spoke with the patient via phone to ensure availability of phone/video source, confirm preferred email & phone number, and  discuss instructions and expectations.  I reminded Adam Hebert to be prepared with any vital sign and/or heart rhythm information that could potentially be obtained via home monitoring, at the time of his visit. I reminded Adam Hebert to expect a phone call at the time of his visit if his visit.  Did the patient verbally acknowledge consent to treatment? Yes  Dorris Fetch, CMA 03/04/2019 2:36 PM   DOWNLOADING THE WEBEX SOFTWARE TO SMARTPHONE  - If Apple, go to [employer] and type in WebEx in the search bar. Download [employer], the blue/green circle. The app is free but as with any other app downloads, their phone may require them to verify saved payment information or Apple password. The patient does NOT have to create an account.  - If Android, ask patient to go to [employer] and type in WebEx in the search bar. Download [employer], the blue/green circle. The app is free but as with any other app downloads, their phone may require them to verify saved payment information or Android password. The patient does NOT have to create an account.   CONSENT FOR TELE-HEALTH VISIT - PLEASE REVIEW  I hereby voluntarily request, consent and authorize [facility] and its employed or contracted physicians, physician assistants, nurse practitioners or other licensed health care professionals (the Practitioner), to provide me with telemedicine health care services (the "Services") as deemed necessary by the treating Practitioner. I  acknowledge and consent to receive the Services by the Practitioner via telemedicine. I understand that the telemedicine visit will involve communicating with the Practitioner through live audiovisual communication technology and the disclosure of certain medical information by electronic transmission. I acknowledge that I have been given the opportunity to request an in-person assessment or other available alternative prior to the  telemedicine visit and am voluntarily participating in the telemedicine visit.  I understand that I have the right to withhold or withdraw my consent to the use of telemedicine in the course of my care at any time, without affecting my right to future care or treatment, and that the Practitioner or I may terminate the telemedicine visit at any time. I understand that I have the right to inspect all information obtained and/or recorded in the course of the telemedicine visit and may receive copies of available information for a reasonable fee.  I understand that some of the potential risks of receiving the Services via telemedicine include:  Marland Kitchen Delay or interruption in medical evaluation due to technological equipment failure or disruption; . Information transmitted may not be sufficient (e.g. poor resolution of images) to allow for appropriate medical decision making by the Practitioner; and/or  . In rare instances, security protocols could fail, causing a breach of personal health information.  Furthermore, I acknowledge that it is my responsibility to provide information about my medical history, conditions and care that is complete and accurate to the best of my ability. I acknowledge that Practitioner's advice, recommendations, and/or decision may be based on factors not within their control, such as incomplete or inaccurate data provided by me or distortions of diagnostic images or specimens that may result from electronic transmissions. I understand that the practice of medicine is not an exact science and that Practitioner makes no warranties or guarantees regarding treatment outcomes. I acknowledge that I will receive a copy of this consent concurrently upon execution via email to the email address I last provided but may also request a printed copy by calling the office of [facility].    I understand that my insurance will be billed for this visit.   I have read or had this consent read to me.  . I understand the contents of this consent, which adequately explains the benefits and risks of the Services being provided via telemedicine.  . I have been provided ample opportunity to ask questions regarding this consent and the Services and have had my questions answered to my satisfaction. . I give my informed consent for the services to be provided through the use of telemedicine in my medical care  By participating in this telemedicine visit I agree to the above.

## 2019-03-04 NOTE — Telephone Encounter (Signed)
Called interpreter line. Interpreter's ID # is [id number]. With the assistance of  Interpreter the patient was informed of switching his in office appointment to a telephone or video visit. Patient chose video and with the assistance of interpreter the patient gave verbal consent to do a telephone visit.

## 2019-03-04 NOTE — Telephone Encounter (Signed)
Called and left a detailed voice message for the patient Adam Hebert and his daughter [name] to call back to the office in regards to his scheduled appointment for 4/3 with Azalee Course, PA-C

## 2019-03-05 ENCOUNTER — Encounter: Payer: Self-pay | Admitting: Physician Assistant

## 2019-03-05 ENCOUNTER — Telehealth (INDEPENDENT_AMBULATORY_CARE_PROVIDER_SITE_OTHER): Payer: BLUE CROSS/BLUE SHIELD | Admitting: Physician Assistant

## 2019-03-05 ENCOUNTER — Other Ambulatory Visit: Payer: Self-pay | Admitting: Physician Assistant

## 2019-03-05 ENCOUNTER — Other Ambulatory Visit: Payer: Self-pay

## 2019-03-05 VITALS — BP 108/61 | HR 56 | Ht 65.0 in

## 2019-03-05 DIAGNOSIS — I472 Ventricular tachycardia, unspecified: Secondary | ICD-10-CM

## 2019-03-05 DIAGNOSIS — I5022 Chronic systolic (congestive) heart failure: Secondary | ICD-10-CM | POA: Diagnosis not present

## 2019-03-05 DIAGNOSIS — I10 Essential (primary) hypertension: Secondary | ICD-10-CM

## 2019-03-05 DIAGNOSIS — I5021 Acute systolic (congestive) heart failure: Secondary | ICD-10-CM

## 2019-03-05 DIAGNOSIS — I4729 Other ventricular tachycardia: Secondary | ICD-10-CM

## 2019-03-05 NOTE — Progress Notes (Signed)
Virtual Visit via Telephone Note    Evaluation Performed:  Follow-up visit  This visit type was conducted due to national recommendations for restrictions regarding the COVID-19 Pandemic (e.g. social distancing).  This format is felt to be most appropriate for this patient at this time.  All issues noted in this document were discussed and addressed.  No physical exam was performed (except for noted visual exam findings with Video Visits).  Please refer to the patient's chart (MyChart message for video visits and phone note for telephone visits) for the patient's consent to telehealth for [facility].  Date:  03/05/2019   ID:  Adam Hebert, DOB 04/22/1966, MRN [id number]  Patient Location:  Home  Provider location:   [facility]  PCP:  No primary care provider on file.  Cardiologist:  Nicki Guadalajara, MD  Electrophysiologist:  None   Chief Complaint:  followup  History of Present Illness:    Adam Hebert is a 53 y.o. male who presents via audio/video conferencing for a telehealth visit today.  Adam Hebert is a 53 y.o. [place] speak Arabicwith past medical history of hypertensionandhyperlipidemia who was recently presented to the hospital with near syncope and hypotension. He had bradycardia with heart rate of 40s. Serial troponin was negative. Echocardiogram obtained in the hospital showed EF of 40 to 45%, diffuse hypokinesis, possible disproportionate hypokinesis of the inferior myocardium, consistent with ischemia in the RCA distribution, grade 2 DD. He eventually underwent cardiac catheterization on 12/03/2018 which showed angiographically normal coronaries, EF by LV gram was 30 to 35%, LVEDP was normal. It was recommended to obtain cardiac MRI as outpatient to rule out myocarditis. During the hospitalization, patient had a 22 beats of nonsustained VT this was followed by another 37 beats of nonsustained VT. He was started on Entresto  during the hospitalization. He was also placed on Toprol-XL 100 mg daily.  I last saw the patient on 12/17/2018.  I increase his Entresto to 49-51 mg twice daily.  Outpatient cardiac MRI showed EF 42%, severe inferior hypokinesis, allergy pattern does not appears to be myocarditis.Marland Kitchen  He also needed a 30-day event monitor given episodes of nonsustained VT. I last saw the patient on 02/08/2019, he self discontinued all of his cardiac medication.  I restarted him on Toprol-XL at a lower dose of 50 mg daily.  I also filled out medication assistance form for his Entresto.   I contacted the patient to telephone visit today, he is doing very well on the current medication of metoprolol, Entresto and atorvastatin.  He will occasionally has for some mild lightheadedness about once a week but no presyncope or syncope.  Recent event monitor was completed and was reviewed by Dr. Tresa Endo who is also ventricular ectopy and also a prolonged pause.  Therefore patient was referred to EP service for further evaluation.  Some of the ventricular ectopy occurred prior to him restarting the metoprolol.  Even after restarting metoprolol, he does not have symptom associated with prolonged sinus pause.  Will defer additional evaluation to EP, however I think further monitoring either through heart monitor or symptom diary is warranted.  The patient does not have symptoms concerning for COVID-19 infection (fever, chills, cough, or new shortness of breath).    Prior CV studies:   The following studies were reviewed today:  Echo 12/01/2018 LV EF: 40% -   45% Study Conclusions  - Left ventricle: The cavity size was at the upper limits of   normal. Systolic function  was mildly to moderately reduced. The   estimated ejection fraction was in the range of 40% to 45%.   Diffuse hypokinesis. Possibly disproportionate hypokinesis of the   inferior myocardium; consistent with ischemia in the distribution   of the right coronary  artery. Features are consistent with a   pseudonormal left ventricular filling pattern, with concomitant   abnormal relaxation and increased filling pressure (grade 2   diastolic dysfunction). - Left atrium: The atrium was mildly dilated.   Past Medical History:  Diagnosis Date  . High cholesterol   . Hypertension    Past Surgical History:  Procedure Laterality Date  . LEFT HEART CATH AND CORONARY ANGIOGRAPHY N/A 12/03/2018   Procedure: LEFT HEART CATH AND CORONARY ANGIOGRAPHY;  Surgeon: Lyn Records, MD;  [facility]: [facility] LAB;  Service: Cardiovascular;  Laterality: N/A;     Current Meds  Medication Sig  . atorvastatin (LIPITOR) 40 MG tablet Take 1 tablet (40 mg total) by mouth daily.  . metoprolol succinate (TOPROL-XL) 50 MG 24 hr tablet Take 1 tablet (50 mg total) by mouth daily. Take with or immediately following a meal.  . sacubitril-valsartan (ENTRESTO) 49-51 MG Take 1 tablet by mouth 2 (two) times daily.     Allergies:   Patient has no known allergies.   Social History   Tobacco Use  . Smoking status: Never Smoker  . Smokeless tobacco: Never Used  Substance Use Topics  . Alcohol use: Never    Alcohol/week: 0.0 standard drinks    Frequency: Never  . Drug use: Never     Family Hx: The patient's family history is not on file.  ROS:   Please see the history of present illness.     All other systems reviewed and are negative.   Labs/Other Tests and Data Reviewed:    Recent Labs: 11/30/2018: ALT 26; TSH 2.111 12/04/2018: Hemoglobin 13.9; Platelets 217 12/05/2018: Magnesium 2.1 01/04/2019: BUN 15; Creatinine, Ser 1.00; Potassium 4.2; Sodium 138   Recent Lipid Panel Lab Results  Component Value Date/Time   CHOL 222 (H) 12/01/2018 07:56 AM   CHOL 230 (H) 06/08/2018 09:36 AM   TRIG 251 (H) 12/01/2018 07:56 AM   HDL 34 (L) 12/01/2018 07:56 AM   HDL 37 (L) 06/08/2018 09:36 AM   CHOLHDL 6.5 12/01/2018 07:56 AM   LDLCALC 138 (H) 12/01/2018 07:56 AM    LDLCALC 154 (H) 06/08/2018 09:36 AM    Wt Readings from Last 3 Encounters:  02/08/19 185 lb (83.9 kg)  12/17/18 189 lb (85.7 kg)  12/05/18 183 lb 3.2 oz (83.1 kg)     Objective:    Vital Signs:  BP 108/61   Pulse (!) 56   Ht 5\' 5"  (1.651 m)   BMI 30.79 kg/m    Well nourished, well developed male in no acute distress.   ASSESSMENT & PLAN:    1.  Nonischemic cardiomyopathy: He is tolerating the current medication with Toprol-XL and Entresto.  Continue on current therapy.  Unable to uptitrate medication further due to borderline blood pressure  2. Hypertension: Blood pressure borderline on current medication.  He does occasionally have dizziness about once a week however no presyncope.  3.  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  Dr. Tresa Endo recommended referral to EP.  There was a period of time patient self discontinued all of his cardiac medication while wearing the monitor.  He has since restarted at the lower dose of Toprol-XL however no evidence  of presyncope or syncope.  Will defer to EP to consider either monitoring for symptom or another course of heart monitor in the future  4. Hyperlipidemia: Continue Lipitor.   COVID-19 Education: The signs and symptoms of COVID-19 were discussed with the patient and how to seek care for testing (follow up with PCP or arrange E-visit).  The importance of social distancing was discussed today.  Patient Risk:   After full review of this patient's clinical status, I feel that they are at least moderate risk at this time.  Time:   Today, I have spent 12 minutes with the patient with telehealth technology discussing heart failure medication titration.     Medication Adjustments/Labs and Tests Ordered: Current medicines are reviewed at length with the patient today.  Concerns regarding medicines are outlined above.  Tests Ordered: No orders of the defined types were placed in this encounter.   Medication Changes: No orders of the defined types were placed in this encounter.   Disposition:  Follow up in 2 month(s)  Signed, Azalee Course, [place]  03/05/2019 8:31 AM     [facility]

## 2019-03-05 NOTE — Patient Instructions (Addendum)
Medication Instructions:   Your physician recommends that you continue on your current medications as directed. Please refer to the Current Medication list given to you today.  If you need a refill on your cardiac medications before your next appointment, please call your pharmacy.   Lab work:  None ordered at this time of appointment  If you have labs (blood work) drawn today and your tests are completely normal, you will receive your results only by: Marland Kitchen MyChart Message (if you have MyChart) OR . A paper copy in the mail If you have any lab test that is abnormal or we need to change your treatment, we will call you to review the results.  Testing/Procedures:  None ordered at the time of this appointment   Follow-Up: At [facility], you and your health needs are our priority.  As part of our continuing mission to provide you with exceptional heart care, we have created designated Provider Care Teams.  These Care Teams include your primary Cardiologist (physician) and Advanced Practice Providers (APPs -  Physician Assistants and Nurse Practitioners) who all work together to provide you with the care you need, when you need it. You will need a follow up appointment in 2-3 months.  Please call our office 2 months in advance to schedule this appointment.  You may see Nicki Guadalajara, MD or one of the following Advanced Practice Providers on your designated Care Team: [place], [place] . Micah Flesher, PA-C  Any Other Special Instructions Will Be Listed Below (If Applicable).  Referral sent for Cardiac Electrophysiology

## 2019-03-24 ENCOUNTER — Telehealth: Payer: Self-pay | Admitting: Cardiovascular Disease

## 2019-03-24 NOTE — Telephone Encounter (Signed)
New message   Patient's wants to know if he can fast starting Friday and it will last for 30 days. Please call to discuss.

## 2019-03-24 NOTE — Telephone Encounter (Signed)
The patient was recently found to have LV dysfunction with nonsustained ventricular tachycardia.  I would not recommend fasting for 30 days but perhaps  Isolated days of fasting rather than every day to be better.  I understand this may be for religious reasons.  I am concerned however that this prolonged fast may have adverse cardiovascular consequences.

## 2019-03-24 NOTE — Telephone Encounter (Signed)
Spoke to  family member - patient would like to fast  Nothing to eat or drink from sunrise to sunset for 30 days . It starts on Friday 4/24 . Patient wanted to know from a heart standpoint if it was okay . Aware will defer to dr Tresa Endo and contact them back

## 2019-03-25 ENCOUNTER — Ambulatory Visit: Payer: BLUE CROSS/BLUE SHIELD | Admitting: Cardiovascular Disease

## 2019-03-25 NOTE — Telephone Encounter (Signed)
Called, spoke with family member, gave recommendations from MD.  Patient family member verbalized understanding, will call back with any questions or concerns.

## 2019-04-05 ENCOUNTER — Telehealth: Payer: Self-pay | Admitting: Cardiovascular Disease

## 2019-04-05 NOTE — Telephone Encounter (Signed)
New Message  Patient needs doctor's note to take off of work due to Covid-19.  Please call daughter.

## 2019-04-05 NOTE — Telephone Encounter (Signed)
Spoke with the pt daughter. Asked for additional info regarding the letter requested. Adv the pt daughter that we are not providing letters to "write him out of work" due to COVID-19. Adv pt daughter that if there is a form or statement that the pt employer is requesting they can call back and we can present that info to Dr. Tresa Endo. Pt daughter verbalized understanding.

## 2019-04-05 NOTE — Telephone Encounter (Signed)
Noted! Thank you

## 2019-06-22 ENCOUNTER — Telehealth: Payer: Self-pay | Admitting: Cardiovascular Disease

## 2019-06-23 ENCOUNTER — Ambulatory Visit: Payer: BC Managed Care – PPO | Admitting: Cardiovascular Disease

## 2019-06-29 ENCOUNTER — Telehealth: Payer: Self-pay

## 2019-06-29 NOTE — Telephone Encounter (Signed)
Left message regarding appt on 06/30/19. 

## 2019-06-30 ENCOUNTER — Telehealth: Payer: BC Managed Care – PPO | Admitting: Internal Medicine

## 2019-06-30 ENCOUNTER — Institutional Professional Consult (permissible substitution): Payer: BC Managed Care – PPO | Admitting: Internal Medicine

## 2019-06-30 ENCOUNTER — Other Ambulatory Visit: Payer: Self-pay

## 2019-06-30 DIAGNOSIS — I4729 Other ventricular tachycardia: Secondary | ICD-10-CM

## 2019-06-30 DIAGNOSIS — I472 Ventricular tachycardia: Secondary | ICD-10-CM

## 2019-06-30 NOTE — Progress Notes (Signed)
Called patient twice with no answer. Left voicemail. Will reschedule

## 2019-07-23 ENCOUNTER — Other Ambulatory Visit: Payer: Self-pay | Admitting: Physician Assistant

## 2019-08-18 ENCOUNTER — Telehealth: Payer: BC Managed Care – PPO | Admitting: Physician Assistant

## 2019-09-08 ENCOUNTER — Telehealth: Payer: BC Managed Care – PPO | Admitting: Physician Assistant

## 2019-09-13 ENCOUNTER — Telehealth: Payer: BC Managed Care – PPO | Admitting: Physician Assistant

## 2019-10-14 ENCOUNTER — Telehealth (INDEPENDENT_AMBULATORY_CARE_PROVIDER_SITE_OTHER): Payer: BC Managed Care – PPO | Admitting: Physician Assistant

## 2019-10-14 ENCOUNTER — Encounter: Payer: Self-pay | Admitting: Physician Assistant

## 2019-10-14 VITALS — BP 129/72 | HR 68

## 2019-10-14 DIAGNOSIS — Z79899 Other long term (current) drug therapy: Secondary | ICD-10-CM

## 2019-10-14 DIAGNOSIS — E785 Hyperlipidemia, unspecified: Secondary | ICD-10-CM

## 2019-10-14 DIAGNOSIS — I10 Essential (primary) hypertension: Secondary | ICD-10-CM

## 2019-10-14 DIAGNOSIS — I472 Ventricular tachycardia: Secondary | ICD-10-CM

## 2019-10-14 DIAGNOSIS — I4729 Other ventricular tachycardia: Secondary | ICD-10-CM

## 2019-10-14 DIAGNOSIS — I428 Other cardiomyopathies: Secondary | ICD-10-CM

## 2019-10-14 NOTE — Patient Instructions (Addendum)
Medication Instructions:   Your physician recommends that you continue on your current medications as directed. Please refer to the Current Medication list given to you today.  *If you need a refill on your cardiac medications before your next appointment, please call your pharmacy*  Lab Work: You will need to have labs (blood work) drawn within 2 months:  CMET  Fasting Lipid Panel-DO NOT EAT OR DRINK PAST MIDNIGHT If you have labs (blood work) drawn today and your tests are completely normal, you will receive your results only by: Marland Kitchen MyChart Message (if you have MyChart) OR . A paper copy in the mail If you have any lab test that is abnormal or we need to change your treatment, we will call you to review the results.  Testing/Procedures: Your physician has requested that you have an echocardiogram. Echocardiography is a painless test that uses sound waves to create images of your heart. It provides your doctor with information about the size and shape of your heart and how well your heart's chambers and valves are working. This procedure takes approximately one hour. There are no restrictions for this procedure.   Please schedule within 2 months   Follow-Up: At [facility], you and your health needs are our priority.  As part of our continuing mission to provide you with exceptional heart care, we have created designated Provider Care Teams.  These Care Teams include your primary Cardiologist (physician) and Advanced Practice Providers (APPs -  Physician Assistants and Nurse Practitioners) who all work together to provide you with the care you need, when you need it.  Your next appointment:   6-9 months    The format for your next appointment:   Either In Person or Virtual  Provider:   Shelva Majestic, MD  Other Instructions  After Echocardiogram you will need to be scheduled with Dr. Rayann Heman for a consultation appointment

## 2019-10-14 NOTE — Progress Notes (Signed)
Virtual Visit via Telephone Note   This visit type was conducted due to national recommendations for restrictions regarding the COVID-19 Pandemic (e.g. social distancing) in an effort to limit this patient's exposure and mitigate transmission in our community.  Due to his co-morbid illnesses, this patient is at least at moderate risk for complications without adequate follow up.  This format is felt to be most appropriate for this patient at this time.  The patient did not have access to video technology/had technical difficulties with video requiring transitioning to audio format only (telephone).  All issues noted in this document were discussed and addressed.  No physical exam could be performed with this format.  Please refer to the patient's chart for his  consent to telehealth for [facility].   Date:  10/14/2019   ID:  Adam Hebert, DOB [birth year], MRN [id number]  Patient Location: Home Provider Location: Office  PCP:  Patient, No Pcp Per  Cardiologist:  Shelva Majestic, MD  Electrophysiologist:  None   Evaluation Performed:  Follow-Up Visit  Chief Complaint:  6 month followup  History of Present Illness:    Adam Hebert is a 53 y.o. male from [place] speak Arabicwith past medical history of hypertensionandhyperlipidemia who was recently presented to the hospital with near syncope and hypotension. He had bradycardia with heart rate of 40s. Serial troponin was negative. Echocardiogram obtained in the hospital showed EF of 40 to 45%, diffuse hypokinesis, possible disproportionate hypokinesis of the inferior myocardium, consistent with ischemia in the RCA distribution, grade 2 DD. He eventually underwent cardiac catheterization on 12/03/2018 which showed angiographically normal coronaries, EF by LV gram was 30 to 35%, LVEDP was normal. It was recommended to obtain cardiac MRI as outpatient to rule out myocarditis. During the hospitalization, patient had a 22  beats of nonsustained VT this was followed by another 37 beats of nonsustained VT. He was started on Entresto during the hospitalization. He was also placed on Toprol-XL 100 mg daily. Outpatient cardiac MRI showed EF 42%, severe inferior hypokinesis, LGE pattern does not appears to be myocarditis.Marland Kitchen He also needed a 30-day event monitor given episodes of nonsustained VT.  heart monitor demonstrated ventricular ectopy and prolonged pulse.  Patient was referred to EP service.  Unfortunately by the time I saw him back in March 2020, he has self discontinued all have his cardiac medications.  I restarted him on Toprol-XL at a lower dose of 50 mg daily.  I also filled out medication assistance form for his Entresto.  He unfortunately missed his EP visit with Dr. Rayann Heman on 06/30/2019.  I called Mr. Heims for his virtual visit today.  His daughter was present during the visit and was able to interpret.  He denies any recent chest pain or shortness of breath.  He has no lower extremity edema according to his daughter.  He has been compliant with his medication of Lipitor, Entresto and metoprolol.  His daughter mentioned that he did miss the appointment with Dr. Rayann Heman, she tried to call back to arrange a visit, however was told Dr. Jackalyn Lombard schedule was full.  Since he has been compliant with the heart failure medication, I recommend a repeat echocardiogram prior to setting him up to see Dr. Rayann Heman again.  This will give [place] more information on his LV function prior to EP assessment.  He is also overdue for fasting lipid panel and liver function test.  Given the fact that he is on the [place], I will change to  liver function test to complete metabolic panel in order to obtain information on his renal function, electrolyte and liver function at the same time.   The patient does not have symptoms concerning for COVID-19 infection (fever, chills, cough, or new shortness of breath).    Past Medical History:  Diagnosis  Date  . High cholesterol   . Hypertension    Past Surgical History:  Procedure Laterality Date  . LEFT HEART CATH AND CORONARY ANGIOGRAPHY N/A 12/03/2018   Procedure: LEFT HEART CATH AND CORONARY ANGIOGRAPHY;  Surgeon: Lyn Records, MD;  Location: [facility] INVASIVE CV LAB;  Service: Cardiovascular;  Laterality: N/A;     Current Meds  Medication Sig  . atorvastatin (LIPITOR) 40 MG tablet Take 1 tablet (40 mg total) by mouth daily.  Marland Kitchen ENTRESTO 49-51 MG TAKE 1 TABLET BY MOUTH TWICE A DAY  . metoprolol succinate (TOPROL-XL) 50 MG 24 hr tablet TAKE 1 TABLET (50 MG TOTAL) BY MOUTH DAILY. TAKE WITH OR IMMEDIATELY FOLLOWING A MEAL.     Allergies:   Patient has no known allergies.   Social History   Tobacco Use  . Smoking status: Never Smoker  . Smokeless tobacco: Never Used  Substance Use Topics  . Alcohol use: Never    Alcohol/week: 0.0 standard drinks    Frequency: Never  . Drug use: Never     Family Hx: The patient's family history is not on file.  ROS:   Please see the history of present illness.     All other systems reviewed and are negative.   Prior CV studies:   The following studies were reviewed today:  Cath 12/03/2018  Right coronary dominance.  Angiographically normal coronary arteries.  Global left ventricular systolic dysfunction with EF estimated in the 30 to 35% range.  LVEDP is normal.  Findings are consistent with chronic systolic heart failure versus acute systolic heart failure with compensated hemodynamics.  RECOMMENDATIONS:   Guideline directed therapy for left ventricular systolic dysfunction: ARNI, beta-blocker, and MRA as tolerated by blood pressure.  Consider myocarditis as etiology for reduced EF.  Check screening inflammatory markers (CRP/sed rate) and rule out sarcoid.  Labs/Other Tests and Data Reviewed:    EKG:  An ECG dated 02/08/2019 was personally reviewed today and demonstrated:  Normal sinus rhythm with nonspecific ST-T wave changes   Recent Labs: 11/30/2018: ALT 26; TSH 2.111 12/04/2018: Hemoglobin 13.9; Platelets 217 12/05/2018: Magnesium 2.1 01/04/2019: BUN 15; Creatinine, Ser 1.00; Potassium 4.2; Sodium 138   Recent Lipid Panel Lab Results  Component Value Date/Time   CHOL 222 (H) 12/01/2018 07:56 AM   CHOL 230 (H) 06/08/2018 09:36 AM   TRIG 251 (H) 12/01/2018 07:56 AM   HDL 34 (L) 12/01/2018 07:56 AM   HDL 37 (L) 06/08/2018 09:36 AM   CHOLHDL 6.5 12/01/2018 07:56 AM   LDLCALC 138 (H) 12/01/2018 07:56 AM   LDLCALC 154 (H) 06/08/2018 09:36 AM    Wt Readings from Last 3 Encounters:  02/08/19 185 lb (83.9 kg)  12/17/18 189 lb (85.7 kg)  12/05/18 183 lb 3.2 oz (83.1 kg)     Objective:    Vital Signs:  BP 129/72   Pulse 68    VITAL SIGNS:  reviewed  ASSESSMENT & PLAN:    1. Nonischemic cardiomyopathy  -EF was 40 to 45% by echocardiogram in 2019.  Cardiac catheterization in January 2020 showed essentially normal coronary arteries, with EF 30 to 35%.  He has been compliant with Entresto and Toprol-XL, will proceed with  repeat echocardiogram to reassess ejection fraction.  2. Nonsustained VT: Seen on heart monitor earlier this year.  He was referred to EP, however missed his appointment with Dr. Johney Frame.  Will need to rearrange.  I will try to set up the appointment after the repeat echocardiogram.  3. Hypertension: Blood pressure stable on current therapy.  4. Hyperlipidemia: Continue Lipitor.  Obtain CMP and fasting lipid panel within the next 2 months.   COVID-19 Education: The signs and symptoms of COVID-19 were discussed with the patient and how to seek care for testing (follow up with PCP or arrange E-visit).  The importance of social distancing was discussed today.  Time:   Today, I have spent 15 minutes with the patient with telehealth technology discussing the above problems.     Medication Adjustments/Labs and Tests Ordered: Current medicines are reviewed at length with the patient today.   Concerns regarding medicines are outlined above.   Tests Ordered: Orders Placed This Encounter  Procedures  . Comprehensive metabolic panel  . Lipid panel  . ECHOCARDIOGRAM COMPLETE    Medication Changes: No orders of the defined types were placed in this encounter.   Follow Up:  Either In Person or Virtual in 2 month(s) with Dr. Johney Frame, 6-9 months with Dr. Tresa Endo   Signed, Azalee Course, [place]  10/14/2019 9:28 AM    [facility]

## 2019-11-20 ENCOUNTER — Other Ambulatory Visit: Payer: Self-pay | Admitting: Physician Assistant

## 2019-12-14 ENCOUNTER — Other Ambulatory Visit (HOSPITAL_COMMUNITY): Payer: BC Managed Care – PPO

## 2019-12-17 LAB — LIPID PANEL
Chol/HDL Ratio: 5.2 ratio — ABNORMAL HIGH (ref 0.0–5.0)
Cholesterol, Total: 181 mg/dL (ref 100–199)
HDL: 35 mg/dL — ABNORMAL LOW (ref >39)
LDL Chol Calc (NIH): 111 mg/dL — ABNORMAL HIGH (ref 0–99)
Triglycerides: 200 mg/dL — ABNORMAL HIGH (ref 0–149)
VLDL Cholesterol Cal: 35 mg/dL (ref 5–40)

## 2019-12-17 LAB — COMPREHENSIVE METABOLIC PANEL
ALT: 15 IU/L (ref 0–44)
AST: 23 IU/L (ref 0–40)
Albumin/Globulin Ratio: 1.5 (ref 1.2–2.2)
Albumin: 4.3 g/dL (ref 3.8–4.9)
Alkaline Phosphatase: 116 IU/L (ref 39–117)
BUN/Creatinine Ratio: 14 (ref 9–20)
BUN: 12 mg/dL (ref 6–24)
Bilirubin Total: 0.3 mg/dL (ref 0.0–1.2)
CO2: 23 mmol/L (ref 20–29)
Calcium: 9 mg/dL (ref 8.7–10.2)
Chloride: 102 mmol/L (ref 96–106)
Creatinine, Ser: 0.87 mg/dL (ref 0.76–1.27)
GFR calc Af Amer: 112 mL/min/{1.73_m2} (ref >59)
GFR calc non Af Amer: 97 mL/min/{1.73_m2} (ref >59)
Globulin, Total: 2.9 g/dL (ref 1.5–4.5)
Glucose: 100 mg/dL — ABNORMAL HIGH (ref 65–99)
Potassium: 4.3 mmol/L (ref 3.5–5.2)
Sodium: 139 mmol/L (ref 134–144)
Total Protein: 7.2 g/dL (ref 6.0–8.5)

## 2019-12-24 ENCOUNTER — Other Ambulatory Visit: Payer: Self-pay

## 2019-12-24 ENCOUNTER — Encounter (HOSPITAL_COMMUNITY): Payer: Self-pay | Admitting: Radiology

## 2019-12-30 ENCOUNTER — Other Ambulatory Visit: Payer: Self-pay

## 2019-12-30 ENCOUNTER — Telehealth (INDEPENDENT_AMBULATORY_CARE_PROVIDER_SITE_OTHER): Payer: BC Managed Care – PPO | Admitting: Internal Medicine

## 2019-12-30 ENCOUNTER — Encounter: Payer: Self-pay | Admitting: Internal Medicine

## 2019-12-30 VITALS — BP 124/69 | HR 67 | Ht 65.0 in | Wt 202.0 lb

## 2019-12-30 DIAGNOSIS — I519 Heart disease, unspecified: Secondary | ICD-10-CM | POA: Diagnosis not present

## 2019-12-30 DIAGNOSIS — I428 Other cardiomyopathies: Secondary | ICD-10-CM

## 2019-12-30 DIAGNOSIS — I472 Ventricular tachycardia, unspecified: Secondary | ICD-10-CM

## 2019-12-30 DIAGNOSIS — I10 Essential (primary) hypertension: Secondary | ICD-10-CM

## 2019-12-30 NOTE — Progress Notes (Signed)
Electrophysiology TeleHealth Note   Due to national recommendations of social distancing due to [place] 19, Audio telehealth visit is felt to be most appropriate for this patient at this time.  See MyChart message from today for patient consent regarding telehealth for [facility].  History is obtained with assistance of his daughter who functions as a Optometrist.  Patient also speaks reasonable english.   Date:  12/30/2019   ID:  Adam Hebert, DOB 08-16-1966, MRN [id number]  Location: home  Provider location: [address] [place] Evaluation Performed: New patient consult  PCP:  Patient, No Pcp Per  Cardiologist:  Shelva Majestic, MD  Electrophysiologist:  None   Chief Complaint:  Pre-syncope  History of Present Illness:    Adam Hebert is a 54 y.o. male who presents via audio/video conferencing for a telehealth visit today.   The patient is referred for new consultation regarding syncope and NSVT by Dr Claiborne Billings and Almyra Deforest. He initially presented 12/2018 with dizziness and presyncope.  He had nonischemic CM with EF 40%.  He wore a heart monitor which revealed NSVT.  Treatment has been complicated by noncompliance with medicines and visits. He has done well clinically.  He has had no further dizziness, presyncope or syncope.  He denies any symptoms of arrhythmia.  Today, he denies symptoms of palpitations, chest pain, shortness of breath, orthopnea, PND, lower extremity edema, claudication, dizziness, presyncope, syncope, bleeding, or neurologic sequela. The patient is tolerating medications without difficulties and is otherwise without complaint today.     Past Medical History:  Diagnosis Date  . High cholesterol   . Hypertension   . Nonischemic cardiomyopathy ([place])   . NSVT (nonsustained ventricular tachycardia) ([facility])     Past Surgical History:  Procedure Laterality Date  . LEFT HEART CATH AND CORONARY ANGIOGRAPHY N/A 12/03/2018   Procedure:  LEFT HEART CATH AND CORONARY ANGIOGRAPHY;  Surgeon: Belva Crome, MD;  Location: [facility];  Service: Cardiovascular;  Laterality: N/A;    Current Outpatient Medications  Medication Sig Dispense Refill  . atorvastatin (LIPITOR) 40 MG tablet TAKE 1 TABLET BY MOUTH EVERY DAY 90 tablet 3  . ENTRESTO 49-51 MG TAKE 1 TABLET BY MOUTH TWICE A DAY 180 tablet 2  . metoprolol succinate (TOPROL-XL) 50 MG 24 hr tablet TAKE 1 TABLET (50 MG TOTAL) BY MOUTH DAILY. TAKE WITH OR IMMEDIATELY FOLLOWING A MEAL. 90 tablet 1   No current facility-administered medications for this visit.    Allergies:   Patient has no known allergies.   Social History:  The patient  reports that he has never smoked. He has never used smokeless tobacco. He reports that he does not drink alcohol or use drugs.   Family History:  Denies FH of sudden death, arrhythmia, or syncope   ROS:  Please see the history of present illness.   All other systems are personally reviewed and negative.    Exam:    Vital Signs:  BP 124/69   Pulse 67   Ht 5\' 5"  (1.651 m)   Wt 202 lb (91.6 kg)   BMI 33.61 kg/m    Well sounding, alert and conversant  Answers most questions appropriately in english,  Daughter helps with interpretation/ clarification  Labs/Other Tests and Data Reviewed:    Recent Labs: 12/17/2019: ALT 15; BUN 12; Creatinine, Ser 0.87; Potassium 4.3; Sodium 139   Wt Readings from Last 3 Encounters:  12/30/19 202 lb (91.6 kg)  02/08/19 185  lb (83.9 kg)  12/17/18 189 lb (85.7 kg)     Other studies personally reviewed: Additional studies/ records that were reviewed today include: prior MRI, Cath and echo are reviewed,  Prior hospital records, event monitor reviewed  Review of the above records today demonstrates: as above    ASSESSMENT & PLAN:    1.  Nonischemic CM (EF 42%), NSVT, chronic systolic dysfunction NYHA Class II currently Treatment has been limited by medicine compliance He reports that he is  taking his medicines currently but does not wish to take them long term.  The importance of medical therapy and close follow-up with general cardiology were discussed today. He has repeat echo pending. Does not currently meet criteria for ICD. Asymptomatic with NSVT and is not interested in more aggressive treatment at this time.  2. HTN Stable No change required today   Follow-up:  Echo rescheduled Close follow-up with general cardiology for medicine titration and reinforcement of treatment goals is advised  EP to see as needed  Current medicines are reviewed at length with the patient today.   The patient does not have concerns regarding his medicines.  The following changes were made today:  none  Labs/ tests ordered today include:  No orders of the defined types were placed in this encounter.   Patient Risk:  after full review of this patients clinical status, I feel that they are at moderate risk at this time.   Today, I have spent 20 minutes with the patient with telehealth technology discussing CHF .    SignedHillis Range MD, [facility] 12/30/2019 9:09 AM   [facility] [address] [place] [address] [phone] (office) [phone] (fax)

## 2020-01-05 ENCOUNTER — Encounter (HOSPITAL_COMMUNITY): Payer: Self-pay | Admitting: Radiology

## 2020-01-12 ENCOUNTER — Ambulatory Visit (HOSPITAL_COMMUNITY): Payer: BC Managed Care – PPO | Attending: Internal Medicine

## 2020-01-12 ENCOUNTER — Other Ambulatory Visit: Payer: Self-pay

## 2020-01-12 DIAGNOSIS — I428 Other cardiomyopathies: Secondary | ICD-10-CM | POA: Diagnosis not present

## 2020-01-13 ENCOUNTER — Other Ambulatory Visit: Payer: Self-pay

## 2020-01-13 DIAGNOSIS — E782 Mixed hyperlipidemia: Secondary | ICD-10-CM

## 2020-01-13 DIAGNOSIS — Z79899 Other long term (current) drug therapy: Secondary | ICD-10-CM

## 2020-01-13 MED ORDER — ATORVASTATIN CALCIUM 80 MG PO TABS: 40.0000 mg | ORAL_TABLET | Freq: Every day | ORAL | 6 refills | Status: AC

## 2020-01-20 ENCOUNTER — Other Ambulatory Visit: Payer: Self-pay | Admitting: Physician Assistant

## 2020-01-20 DIAGNOSIS — I5021 Acute systolic (congestive) heart failure: Secondary | ICD-10-CM

## 2020-04-26 ENCOUNTER — Ambulatory Visit: Payer: BC Managed Care – PPO | Admitting: Physician Assistant

## 2020-05-05 ENCOUNTER — Telehealth: Payer: Self-pay | Admitting: Cardiovascular Disease

## 2020-05-05 NOTE — Telephone Encounter (Signed)
I called patient 05/05/20 to schedule appt from recall list. The patient didn't answer so I left message for patient to return call to get that appt schedule

## 2020-05-05 NOTE — Telephone Encounter (Signed)
Called patient 05/05/20 to schedule follow up visit from the recall list, no answer so I left the patient message to return call to get that appt scheduled

## 2020-05-31 NOTE — Telephone Encounter (Signed)
Close this encounter

## 2021-01-13 IMAGING — MR MR CARD MORPHOLOGY WO/W CM
20 of 22 series · 38 of 40 positions shown · IV contrast (Gadavist)
Comparison: none

CLINICAL DATA: Nonischemic cardiomyopathy.

EXAM:
CARDIAC MRI
TECHNIQUE: The patient was scanned on a 1.5 Tesla GE magnet. A dedicated
cardiac coil was used. Functional imaging was done using Fiesta
sequences. [DATE], and 4 chamber views were done to assess for RWMA's.
Modified Bingshen rule using a short axis stack was used to
calculate an ejection fraction on a dedicated work station using
Circle software. The patient received 8 cc of Gadavist. After 10
minutes inversion recovery sequences were used to assess for
infiltration and scar tissue.

[Series 6: cine_trufi_cs_rt_short axis · oblique · 8.0mm · 1.73mm/px · 9 of 210 slices shown (1 of 2)]
[im 1/210]
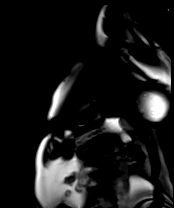
[im 27/210]
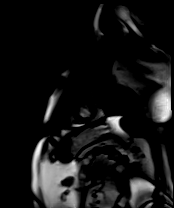
[im 53/210]
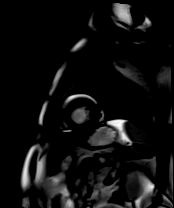
[im 79/210]
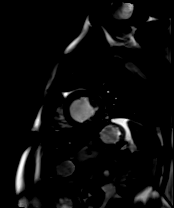
[im 105/210]
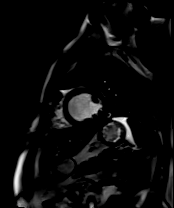
[im 131/210]
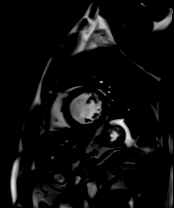
[im 157/210]
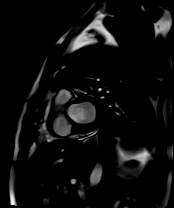
[im 183/210]
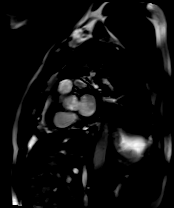
[im 210/210]
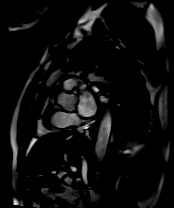

[Series 7: cine_trufi_cs_rt_short axis · oblique · 8.0mm · 1.73mm/px · 11 of 224 slices shown (2 of 2)]
[im 1/224]
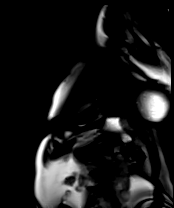
[im 23/224]
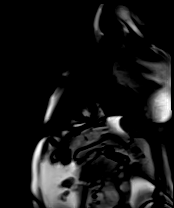
[im 45/224]
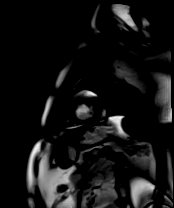
[im 67/224]
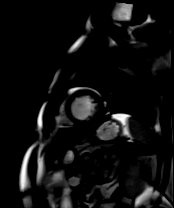
[im 90/224]
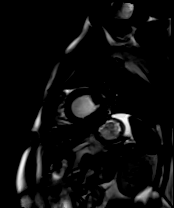
[im 112/224]
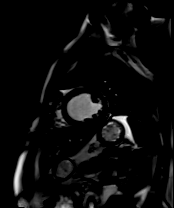
[im 134/224]
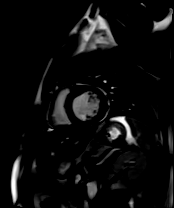
[im 157/224]
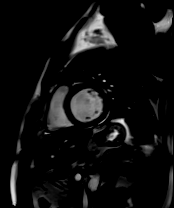
[im 179/224]
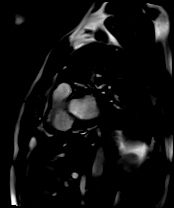
[im 201/224]
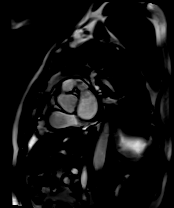
[im 224/224]
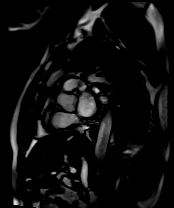

[Series 8: t2_stir_db_sax · oblique · 8.0mm · 1.73mm/px · 1 of 14 slices shown]
[im 1/14]
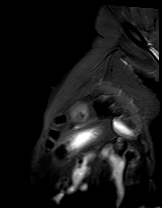

[Series 9: cine_trufi_cs_rt_radial · axial · 6.0mm · 1.54mm/px · 1 of 43 slices shown (1 of 3)]
[im 1/43]
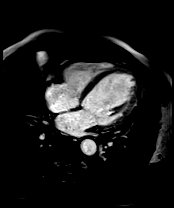

[Series 10: cine_trufi_cs_rt_radial · axial · 6.0mm · 1.54mm/px · 1 of 43 slices shown (2 of 3)]
[im 1/43]
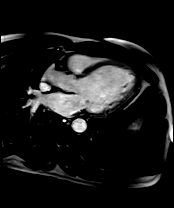

[Series 11: cine_trufi_cs_rt_radial · coronal · 6.0mm · 1.54mm/px · 1 of 43 slices shown (3 of 3)]
[im 1/43]
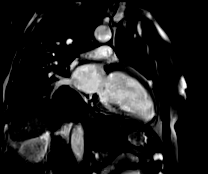

[Series 13: lge_single shot sa · oblique · 8.0mm · 1.77mm/px · 1 of 14 slices shown (1 of 2)]
[im 1/14]
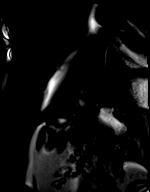

[Series 14: lge_single shot sa · oblique · 8.0mm · 1.77mm/px · 1 of 14 slices shown (2 of 2)]
[im 1/14]
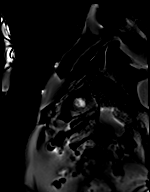

[Series 21: lge_single shot radial_mag · axial · 6.0mm · 1.98mm/px · 1 of 1 slices shown (1 of 6)]
[im 1/1]
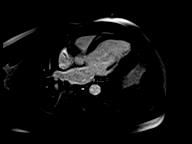

[Series 22: lge_single shot radial_psir · axial · 6.0mm · 1.98mm/px · 1 of 1 slices shown (1 of 6)]
[im 1/1]
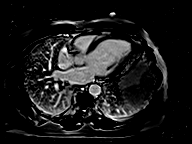

[Series 23: lge_single shot radial_mag · axial · 6.0mm · 1.98mm/px · 1 of 1 slices shown (2 of 6)]
[im 1/1]
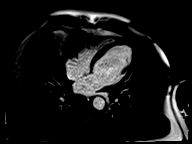

[Series 24: lge_single shot radial_psir · axial · 6.0mm · 1.98mm/px · 1 of 1 slices shown (2 of 6)]
[im 1/1]
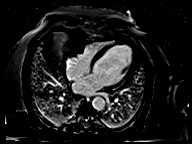

[Series 25: lge_single shot radial_mag · axial · 6.0mm · 1.98mm/px · 1 of 1 slices shown (3 of 6)]
[im 1/1]
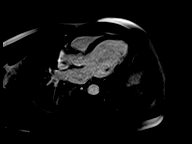

[Series 26: lge_single shot radial_psir · axial · 6.0mm · 1.98mm/px · 1 of 1 slices shown (3 of 6)]
[im 1/1]
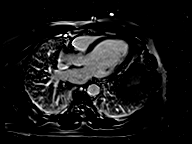

[Series 27: lge_single shot radial_mag · axial · 6.0mm · 1.98mm/px · 1 of 1 slices shown (4 of 6)]
[im 1/1]
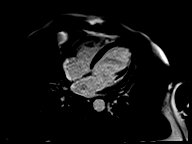

[Series 28: lge_single shot radial_psir · axial · 6.0mm · 1.98mm/px · 1 of 1 slices shown (4 of 6)]
[im 1/1]
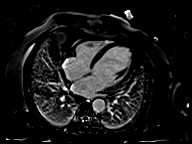

[Series 29: lge_single shot radial_mag · axial · 6.0mm · 1.98mm/px · 1 of 1 slices shown (5 of 6)]
[im 1/1]
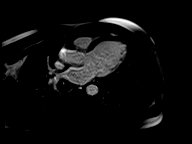

[Series 30: lge_single shot radial_psir · axial · 6.0mm · 1.98mm/px · 1 of 1 slices shown (5 of 6)]
[im 1/1]
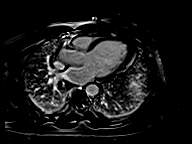

[Series 31: lge_single shot radial_mag · axial · 6.0mm · 1.98mm/px · 1 of 1 slices shown (6 of 6)]
[im 1/1]
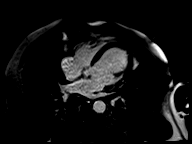

[Series 32: lge_single shot radial_psir · axial · 6.0mm · 1.98mm/px · 1 of 1 slices shown (6 of 6)]
[im 1/1]
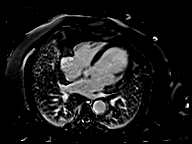

[38 of 40 positions shown; findings below may reference images not displayed]

FINDINGS: Limited images of the lung fields showed no gross abnormalities.

Free breathing sequences used as patient had difficulty with
breath-holding.

Mildly dilated left ventricle with normal wall thickness. EF 42%
with severe inferior hypokinesis. The right ventricle was normal in
size and systolic function, EF 56%. There was mild biatrial
enlargement. Trileaflet aortic valve with no significant stenosis or
regurgitation. No significant mitral regurgitation was noted.

Difficult delayed enhancement images. There may be a thin rim of
subendocardial late gadolinium enhancement (LGE) in the basal to mid
inferior wall and the basal inferolateral wall (<50% wall
thickness).

Measurements:

LVEDV 188 mL

LVSV 79 mL

LVEF 42%

RVEDV 110 mL

RVSV 61 mL

RVEF 56%
IMPRESSION: 1. Mildly dilated left ventricle with EF 42%, severe inferior
hypokinesis.

2.  Normal right ventricular size and systolic function, EF 56%.

3. There was a thin rim (<50% thickness subendocardial) of LGE in
the basal to mid inferior wall segments and the basal inferolateral
wall segment. This is suggestive of subendocardial MI, but recent
cath showed no significant coronary disease. The LGE pattern does
not look like myocarditis involvement but cannot rule out.

Siu Rebelo
